# Patient Record
Sex: Female | Born: 1970 | Race: Black or African American | Hispanic: No | Marital: Single | Smoking: Current every day smoker
Health system: Southern US, Community
[De-identification: ages and names within clinical notes are randomized; demographics above are authoritative.]

## PROBLEM LIST (undated history)

## (undated) DIAGNOSIS — I4891 Unspecified atrial fibrillation: Secondary | ICD-10-CM

## (undated) DIAGNOSIS — G459 Transient cerebral ischemic attack, unspecified: Secondary | ICD-10-CM

## (undated) DIAGNOSIS — E119 Type 2 diabetes mellitus without complications: Secondary | ICD-10-CM

---

## 2004-02-05 ENCOUNTER — Other Ambulatory Visit: Admission: RE | Admit: 2004-02-05 | Discharge: 2004-02-05 | Payer: Self-pay | Admitting: Obstetrics and Gynecology

## 2004-04-25 ENCOUNTER — Inpatient Hospital Stay (HOSPITAL_COMMUNITY): Admission: AD | Admit: 2004-04-25 | Discharge: 2004-04-25 | Payer: Self-pay | Admitting: Obstetrics and Gynecology

## 2004-05-10 ENCOUNTER — Inpatient Hospital Stay (HOSPITAL_COMMUNITY): Admission: AD | Admit: 2004-05-10 | Discharge: 2004-05-10 | Payer: Self-pay | Admitting: Obstetrics and Gynecology

## 2004-05-19 ENCOUNTER — Inpatient Hospital Stay (HOSPITAL_COMMUNITY): Admission: AD | Admit: 2004-05-19 | Discharge: 2004-05-19 | Payer: Self-pay | Admitting: Obstetrics and Gynecology

## 2004-05-24 ENCOUNTER — Inpatient Hospital Stay (HOSPITAL_COMMUNITY): Admission: AD | Admit: 2004-05-24 | Discharge: 2004-05-24 | Payer: Self-pay | Admitting: Obstetrics and Gynecology

## 2004-05-28 ENCOUNTER — Inpatient Hospital Stay (HOSPITAL_COMMUNITY): Admission: AD | Admit: 2004-05-28 | Discharge: 2004-05-28 | Payer: Self-pay | Admitting: Obstetrics and Gynecology

## 2004-06-09 ENCOUNTER — Inpatient Hospital Stay (HOSPITAL_COMMUNITY): Admission: AD | Admit: 2004-06-09 | Discharge: 2004-06-12 | Payer: Self-pay | Admitting: Obstetrics and Gynecology

## 2004-07-02 ENCOUNTER — Inpatient Hospital Stay (HOSPITAL_COMMUNITY): Admission: AD | Admit: 2004-07-02 | Discharge: 2004-07-02 | Payer: Self-pay | Admitting: Obstetrics and Gynecology

## 2004-07-20 ENCOUNTER — Inpatient Hospital Stay (HOSPITAL_COMMUNITY): Admission: AD | Admit: 2004-07-20 | Discharge: 2004-07-20 | Payer: Self-pay | Admitting: Obstetrics and Gynecology

## 2004-07-22 ENCOUNTER — Inpatient Hospital Stay (HOSPITAL_COMMUNITY): Admission: AD | Admit: 2004-07-22 | Discharge: 2004-07-24 | Payer: Self-pay | Admitting: Obstetrics and Gynecology

## 2004-10-02 ENCOUNTER — Ambulatory Visit (HOSPITAL_COMMUNITY): Admission: RE | Admit: 2004-10-02 | Discharge: 2004-10-02 | Payer: Self-pay | Admitting: Obstetrics and Gynecology

## 2005-06-23 ENCOUNTER — Emergency Department (HOSPITAL_COMMUNITY): Admission: EM | Admit: 2005-06-23 | Discharge: 2005-06-23 | Payer: Self-pay | Admitting: Emergency Medicine

## 2006-08-16 ENCOUNTER — Emergency Department (HOSPITAL_COMMUNITY): Admission: EM | Admit: 2006-08-16 | Discharge: 2006-08-16 | Payer: Self-pay | Admitting: Emergency Medicine

## 2006-08-18 ENCOUNTER — Emergency Department (HOSPITAL_COMMUNITY): Admission: EM | Admit: 2006-08-18 | Discharge: 2006-08-19 | Payer: Self-pay | Admitting: Emergency Medicine

## 2006-08-30 ENCOUNTER — Emergency Department (HOSPITAL_COMMUNITY): Admission: EM | Admit: 2006-08-30 | Discharge: 2006-08-30 | Payer: Self-pay | Admitting: Emergency Medicine

## 2006-09-01 ENCOUNTER — Ambulatory Visit (HOSPITAL_COMMUNITY): Admission: RE | Admit: 2006-09-01 | Discharge: 2006-09-01 | Payer: Self-pay | Admitting: *Deleted

## 2006-09-20 ENCOUNTER — Emergency Department (HOSPITAL_COMMUNITY): Admission: EM | Admit: 2006-09-20 | Discharge: 2006-09-21 | Payer: Self-pay | Admitting: Emergency Medicine

## 2006-10-05 ENCOUNTER — Emergency Department (HOSPITAL_COMMUNITY): Admission: EM | Admit: 2006-10-05 | Discharge: 2006-10-06 | Payer: Self-pay | Admitting: Emergency Medicine

## 2006-10-09 ENCOUNTER — Emergency Department (HOSPITAL_COMMUNITY): Admission: EM | Admit: 2006-10-09 | Discharge: 2006-10-09 | Payer: Self-pay | Admitting: Emergency Medicine

## 2006-10-14 ENCOUNTER — Emergency Department (HOSPITAL_COMMUNITY): Admission: EM | Admit: 2006-10-14 | Discharge: 2006-10-14 | Payer: Self-pay | Admitting: Emergency Medicine

## 2006-10-28 ENCOUNTER — Emergency Department (HOSPITAL_COMMUNITY): Admission: EM | Admit: 2006-10-28 | Discharge: 2006-10-29 | Payer: Self-pay | Admitting: Emergency Medicine

## 2006-11-09 ENCOUNTER — Emergency Department (HOSPITAL_COMMUNITY): Admission: EM | Admit: 2006-11-09 | Discharge: 2006-11-09 | Payer: Self-pay | Admitting: Emergency Medicine

## 2006-11-24 ENCOUNTER — Emergency Department (HOSPITAL_COMMUNITY): Admission: EM | Admit: 2006-11-24 | Discharge: 2006-11-24 | Payer: Self-pay | Admitting: Emergency Medicine

## 2006-11-29 ENCOUNTER — Emergency Department (HOSPITAL_COMMUNITY): Admission: EM | Admit: 2006-11-29 | Discharge: 2006-11-30 | Payer: Self-pay | Admitting: Emergency Medicine

## 2006-12-11 ENCOUNTER — Emergency Department (HOSPITAL_COMMUNITY): Admission: EM | Admit: 2006-12-11 | Discharge: 2006-12-12 | Payer: Self-pay | Admitting: *Deleted

## 2006-12-14 ENCOUNTER — Emergency Department (HOSPITAL_COMMUNITY): Admission: EM | Admit: 2006-12-14 | Discharge: 2006-12-15 | Payer: Self-pay | Admitting: Emergency Medicine

## 2006-12-17 ENCOUNTER — Emergency Department (HOSPITAL_COMMUNITY): Admission: EM | Admit: 2006-12-17 | Discharge: 2006-12-17 | Payer: Self-pay | Admitting: Emergency Medicine

## 2006-12-23 ENCOUNTER — Emergency Department (HOSPITAL_COMMUNITY): Admission: EM | Admit: 2006-12-23 | Discharge: 2006-12-23 | Payer: Self-pay | Admitting: Emergency Medicine

## 2007-01-14 ENCOUNTER — Emergency Department (HOSPITAL_COMMUNITY): Admission: EM | Admit: 2007-01-14 | Discharge: 2007-01-15 | Payer: Self-pay | Admitting: Emergency Medicine

## 2007-01-17 ENCOUNTER — Emergency Department (HOSPITAL_COMMUNITY): Admission: EM | Admit: 2007-01-17 | Discharge: 2007-01-18 | Payer: Self-pay | Admitting: Emergency Medicine

## 2007-01-18 ENCOUNTER — Emergency Department (HOSPITAL_COMMUNITY): Admission: EM | Admit: 2007-01-18 | Discharge: 2007-01-18 | Payer: Self-pay | Admitting: Emergency Medicine

## 2007-01-18 ENCOUNTER — Emergency Department (HOSPITAL_COMMUNITY): Admission: EM | Admit: 2007-01-18 | Discharge: 2007-01-19 | Payer: Self-pay | Admitting: Emergency Medicine

## 2007-01-20 ENCOUNTER — Ambulatory Visit (HOSPITAL_COMMUNITY): Admission: RE | Admit: 2007-01-20 | Discharge: 2007-01-21 | Payer: Self-pay | Admitting: General Surgery

## 2007-01-20 ENCOUNTER — Encounter (INDEPENDENT_AMBULATORY_CARE_PROVIDER_SITE_OTHER): Payer: Self-pay | Admitting: General Surgery

## 2007-01-28 ENCOUNTER — Ambulatory Visit: Payer: Self-pay | Admitting: Vascular Surgery

## 2007-01-28 ENCOUNTER — Ambulatory Visit (HOSPITAL_COMMUNITY): Admission: RE | Admit: 2007-01-28 | Discharge: 2007-01-28 | Payer: Self-pay | Admitting: General Surgery

## 2007-01-28 ENCOUNTER — Encounter (INDEPENDENT_AMBULATORY_CARE_PROVIDER_SITE_OTHER): Payer: Self-pay | Admitting: General Surgery

## 2007-01-29 ENCOUNTER — Emergency Department (HOSPITAL_COMMUNITY): Admission: EM | Admit: 2007-01-29 | Discharge: 2007-01-29 | Payer: Self-pay | Admitting: Emergency Medicine

## 2007-02-03 ENCOUNTER — Emergency Department (HOSPITAL_COMMUNITY): Admission: EM | Admit: 2007-02-03 | Discharge: 2007-02-03 | Payer: Self-pay | Admitting: Emergency Medicine

## 2007-02-10 ENCOUNTER — Emergency Department (HOSPITAL_COMMUNITY): Admission: EM | Admit: 2007-02-10 | Discharge: 2007-02-11 | Payer: Self-pay | Admitting: Emergency Medicine

## 2007-02-22 ENCOUNTER — Emergency Department (HOSPITAL_COMMUNITY): Admission: EM | Admit: 2007-02-22 | Discharge: 2007-02-22 | Payer: Self-pay | Admitting: Emergency Medicine

## 2007-02-26 ENCOUNTER — Emergency Department (HOSPITAL_COMMUNITY): Admission: EM | Admit: 2007-02-26 | Discharge: 2007-02-26 | Payer: Self-pay | Admitting: Emergency Medicine

## 2007-03-21 ENCOUNTER — Emergency Department (HOSPITAL_COMMUNITY): Admission: EM | Admit: 2007-03-21 | Discharge: 2007-03-21 | Payer: Self-pay | Admitting: Emergency Medicine

## 2007-03-31 ENCOUNTER — Emergency Department (HOSPITAL_COMMUNITY): Admission: EM | Admit: 2007-03-31 | Discharge: 2007-03-31 | Payer: Self-pay | Admitting: Emergency Medicine

## 2007-04-03 ENCOUNTER — Emergency Department (HOSPITAL_COMMUNITY): Admission: EM | Admit: 2007-04-03 | Discharge: 2007-04-03 | Payer: Self-pay | Admitting: Emergency Medicine

## 2007-04-05 ENCOUNTER — Emergency Department (HOSPITAL_COMMUNITY): Admission: EM | Admit: 2007-04-05 | Discharge: 2007-04-06 | Payer: Self-pay | Admitting: Emergency Medicine

## 2007-04-10 ENCOUNTER — Emergency Department (HOSPITAL_COMMUNITY): Admission: EM | Admit: 2007-04-10 | Discharge: 2007-04-11 | Payer: Self-pay | Admitting: Emergency Medicine

## 2007-05-28 ENCOUNTER — Emergency Department (HOSPITAL_COMMUNITY): Admission: EM | Admit: 2007-05-28 | Discharge: 2007-05-28 | Payer: Self-pay | Admitting: Emergency Medicine

## 2007-05-31 ENCOUNTER — Emergency Department (HOSPITAL_COMMUNITY): Admission: EM | Admit: 2007-05-31 | Discharge: 2007-05-31 | Payer: Self-pay | Admitting: Emergency Medicine

## 2007-06-15 ENCOUNTER — Emergency Department (HOSPITAL_COMMUNITY): Admission: EM | Admit: 2007-06-15 | Discharge: 2007-06-15 | Payer: Self-pay | Admitting: Emergency Medicine

## 2007-06-16 ENCOUNTER — Emergency Department (HOSPITAL_COMMUNITY): Admission: EM | Admit: 2007-06-16 | Discharge: 2007-06-16 | Payer: Self-pay | Admitting: Emergency Medicine

## 2007-07-12 ENCOUNTER — Emergency Department (HOSPITAL_COMMUNITY): Admission: EM | Admit: 2007-07-12 | Discharge: 2007-07-12 | Payer: Self-pay | Admitting: Emergency Medicine

## 2007-07-15 ENCOUNTER — Emergency Department (HOSPITAL_COMMUNITY): Admission: EM | Admit: 2007-07-15 | Discharge: 2007-07-16 | Payer: Self-pay | Admitting: Emergency Medicine

## 2007-07-17 ENCOUNTER — Emergency Department (HOSPITAL_COMMUNITY): Admission: EM | Admit: 2007-07-17 | Discharge: 2007-07-18 | Payer: Self-pay | Admitting: Emergency Medicine

## 2007-07-19 ENCOUNTER — Emergency Department (HOSPITAL_COMMUNITY): Admission: EM | Admit: 2007-07-19 | Discharge: 2007-07-19 | Payer: Self-pay | Admitting: Emergency Medicine

## 2007-11-30 ENCOUNTER — Emergency Department (HOSPITAL_COMMUNITY): Admission: EM | Admit: 2007-11-30 | Discharge: 2007-12-01 | Payer: Self-pay | Admitting: Emergency Medicine

## 2008-03-13 IMAGING — CT CT ANGIO CHEST
2 of 4 series · 19 of 36 positions shown · IV contrast (omnipaque)
Comparison: 11/24/06.

CLINICAL DATA: Mid sternal chest pain and nausea.   Dyspnea. Question acute pulmonary embolism. 
CT ANGIOGRAPHY OF CHEST:
TECHNIQUE: Multidetector CT imaging of the chest was performed during bolus injection of intravenous contrast.  Multiplanar CT angiographic image reconstructions were generated to evaluate the vascular anatomy.
Contrast: 80 mL Omnipaque 300.

[Series 7: pe 1.0 b40f thins for pacs · axial · 0.70mm/px · z∈[-250,-17]mm · 16 of 259 slices shown]
[im 13/259  lung]
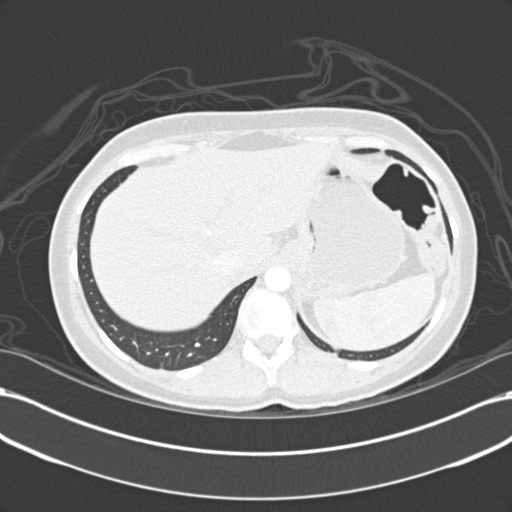
[im 26/259  mediastinal]
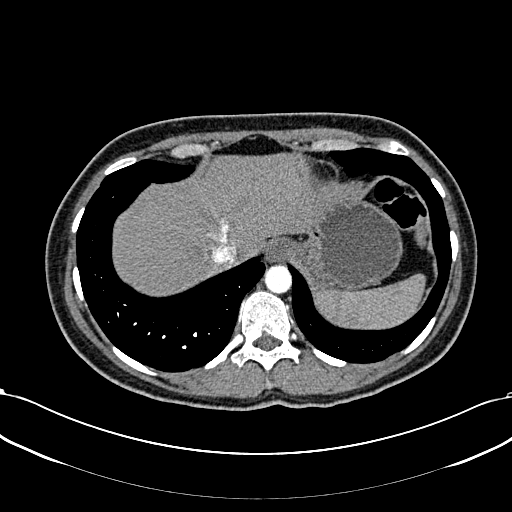
[im 39/259  lung]
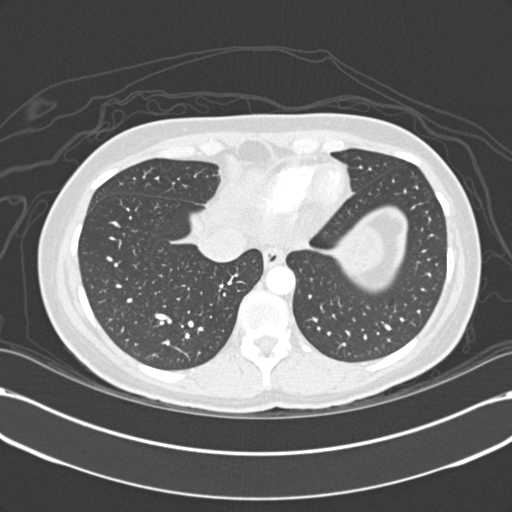
[im 65/259  mediastinal]
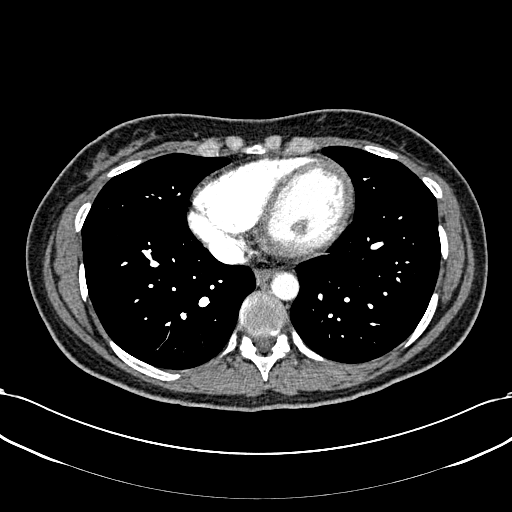
[im 78/259  lung]
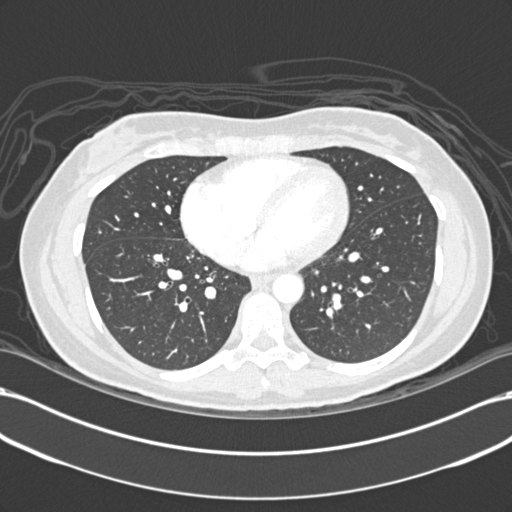
[im 91/259  mediastinal]
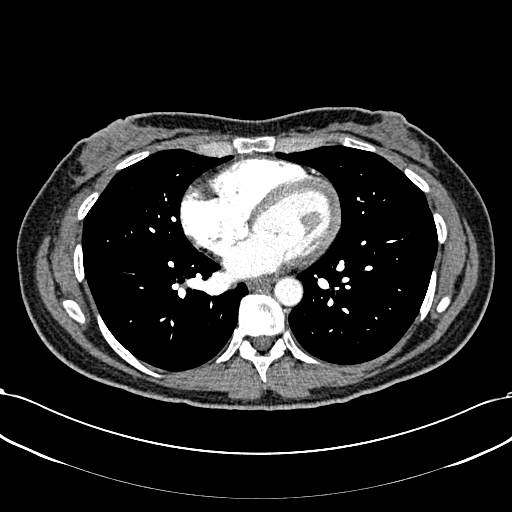
[im 104/259  lung]
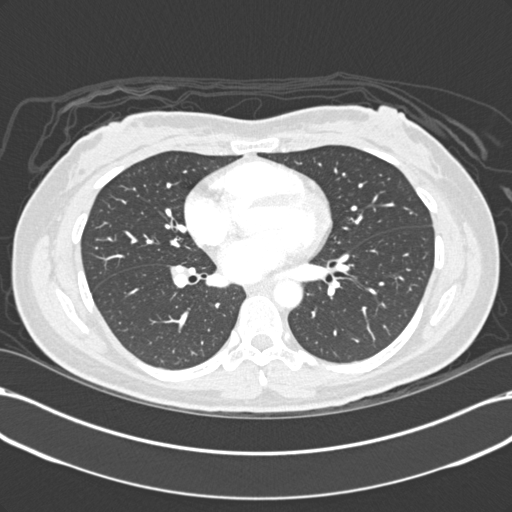
[im 117/259  mediastinal]
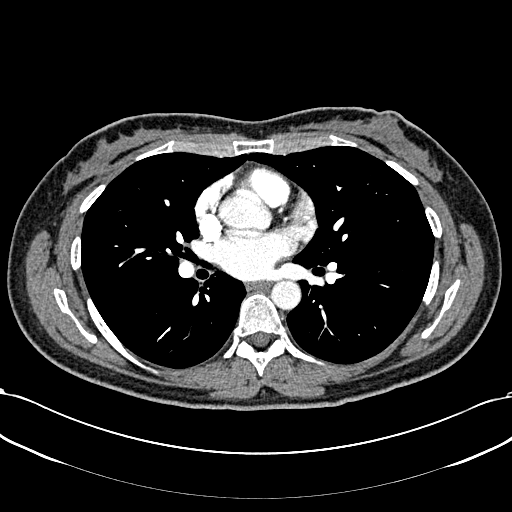
[im 142/259  lung]
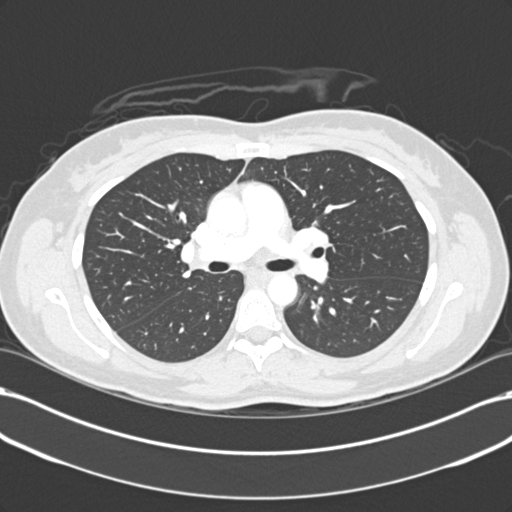
[im 155/259  mediastinal]
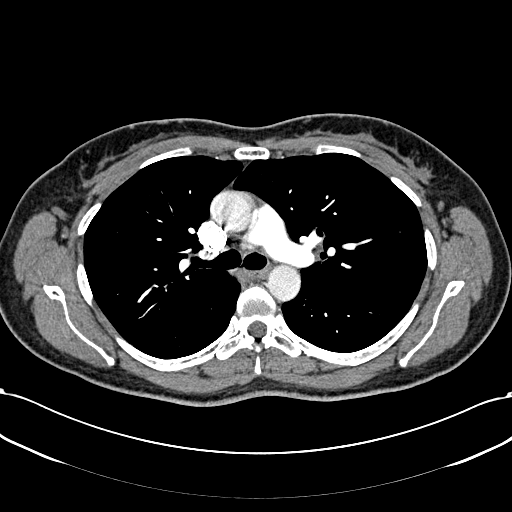
[im 168/259  lung]
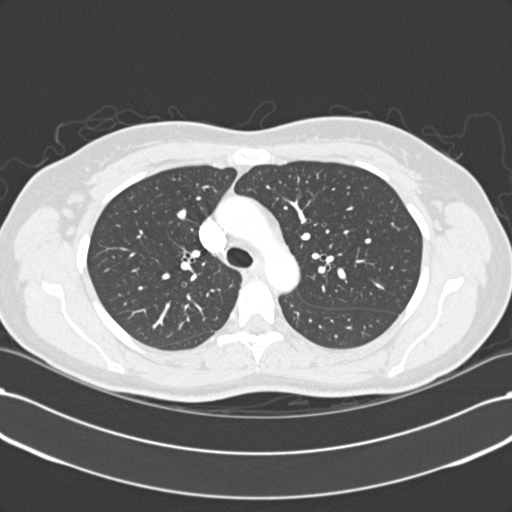
[im 181/259  mediastinal]
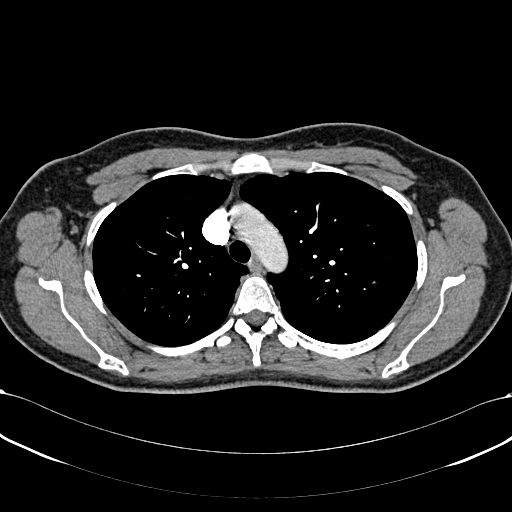
[im 194/259  lung]
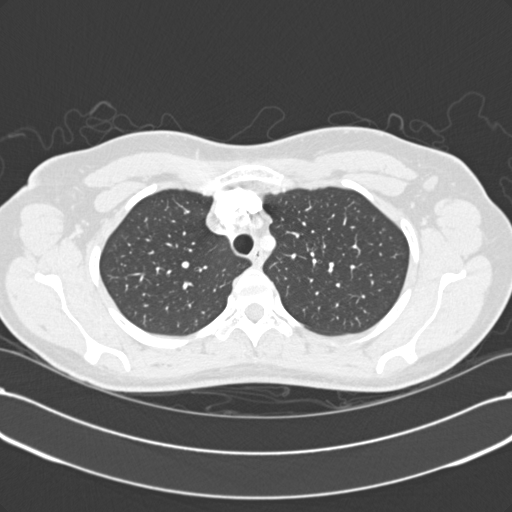
[im 220/259  mediastinal]
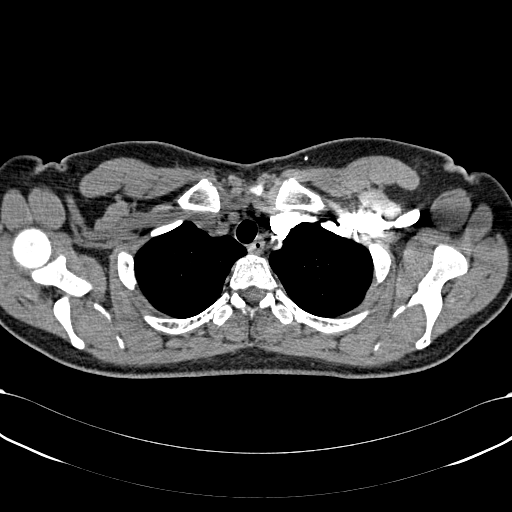
[im 233/259  lung]
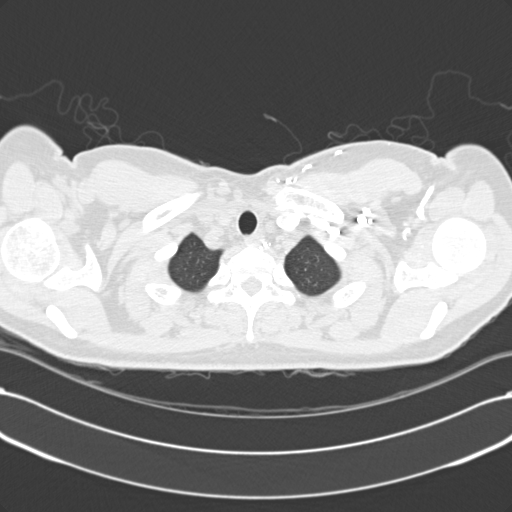
[im 246/259  mediastinal]
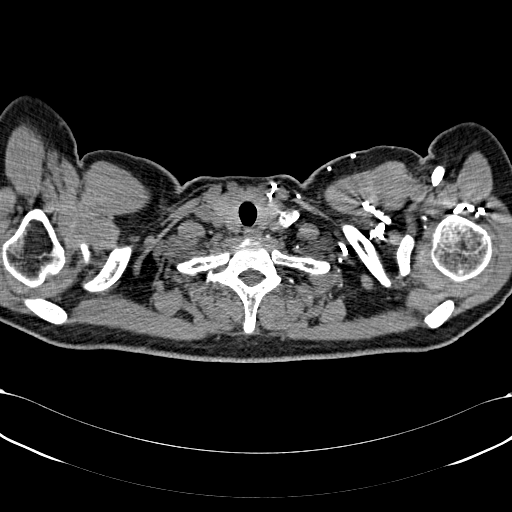

[Series 602: coronal images · coronal · 0.70mm/px · 3 of 67 slices shown]
[im 14/67  mediastinal]
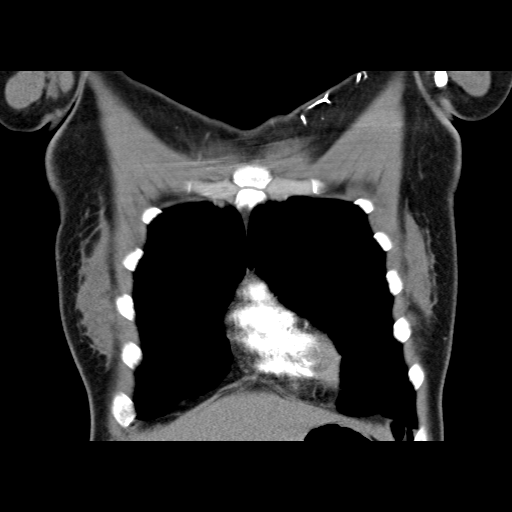
[im 27/67  mediastinal]
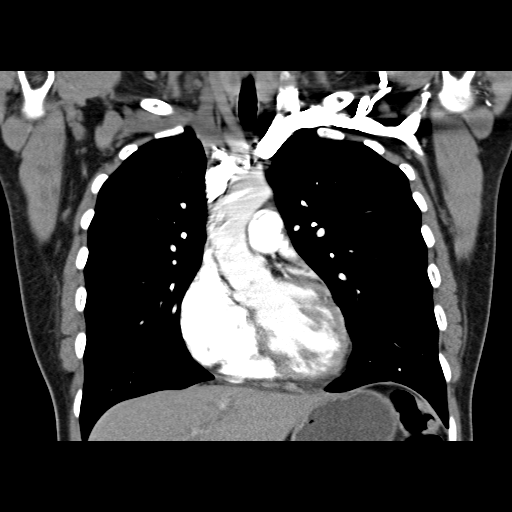
[im 40/67  mediastinal]
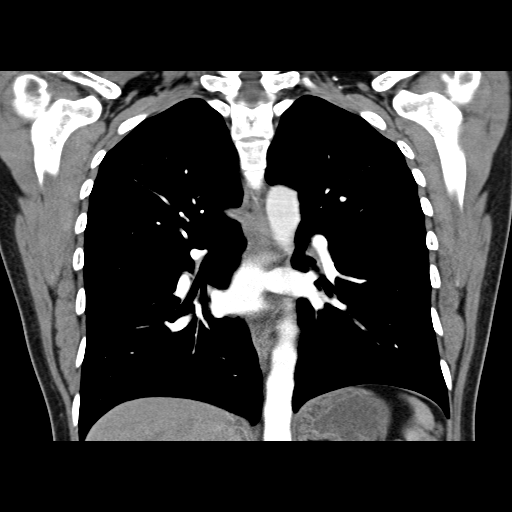

[19 of 36 positions shown; findings below may reference images not displayed]

FINDINGS: There is excellent opacification of the pulmonary arteries. No pulmonary emboli are demonstrated. The mediastinum has a stable appearance without adenopathy or hemorrhage. There is no pleural or pericardial effusion. There is a small lesion posteriorly in the right lobe of the thyroid, measuring 8 mm in diameter. In retrospect, this is unchanged from the prior chest CT of 11/24/06.  I note that the patient had an MRI of the cervical spine on 09/01/06. In reviewing that study, the small thyroid lesion demonstrated high signal on non fat-suppressed T2- and T1-weighted images, and this may reflect a small colloid cyst. The appearance is not entirely specific. The lungs are clear.
IMPRESSION: 1.  No evidence of acute pulmonary embolism or acute chest process.
2.  The small thyroid nodule is unchanged from available priors dating back to 09/01/06. I would suggest ultrasound correlation as this appears to be a single lesion.

## 2008-03-14 ENCOUNTER — Emergency Department (HOSPITAL_COMMUNITY): Admission: EM | Admit: 2008-03-14 | Discharge: 2008-03-14 | Payer: Self-pay | Admitting: Emergency Medicine

## 2008-03-29 ENCOUNTER — Emergency Department (HOSPITAL_COMMUNITY): Admission: EM | Admit: 2008-03-29 | Discharge: 2008-03-29 | Payer: Self-pay | Admitting: Emergency Medicine

## 2008-05-05 ENCOUNTER — Emergency Department (HOSPITAL_COMMUNITY): Admission: EM | Admit: 2008-05-05 | Discharge: 2008-05-05 | Payer: Self-pay | Admitting: Family Medicine

## 2008-06-25 ENCOUNTER — Emergency Department (HOSPITAL_COMMUNITY): Admission: EM | Admit: 2008-06-25 | Discharge: 2008-06-25 | Payer: Self-pay | Admitting: Emergency Medicine

## 2008-07-14 ENCOUNTER — Emergency Department (HOSPITAL_COMMUNITY): Admission: EM | Admit: 2008-07-14 | Discharge: 2008-07-14 | Payer: Self-pay | Admitting: Emergency Medicine

## 2008-07-30 ENCOUNTER — Emergency Department (HOSPITAL_COMMUNITY): Admission: EM | Admit: 2008-07-30 | Discharge: 2008-07-30 | Payer: Self-pay | Admitting: Family Medicine

## 2008-07-31 ENCOUNTER — Emergency Department (HOSPITAL_COMMUNITY): Admission: EM | Admit: 2008-07-31 | Discharge: 2008-07-31 | Payer: Self-pay | Admitting: *Deleted

## 2008-10-23 ENCOUNTER — Observation Stay (HOSPITAL_COMMUNITY): Admission: EM | Admit: 2008-10-23 | Discharge: 2008-10-23 | Payer: Self-pay | Admitting: Emergency Medicine

## 2008-10-31 ENCOUNTER — Emergency Department (HOSPITAL_COMMUNITY): Admission: EM | Admit: 2008-10-31 | Discharge: 2008-10-31 | Payer: Self-pay | Admitting: Emergency Medicine

## 2008-11-22 ENCOUNTER — Emergency Department (HOSPITAL_COMMUNITY): Admission: EM | Admit: 2008-11-22 | Discharge: 2008-11-23 | Payer: Self-pay | Admitting: Emergency Medicine

## 2008-12-05 ENCOUNTER — Emergency Department (HOSPITAL_COMMUNITY): Admission: EM | Admit: 2008-12-05 | Discharge: 2008-12-05 | Payer: Self-pay | Admitting: Family Medicine

## 2008-12-07 ENCOUNTER — Emergency Department (HOSPITAL_COMMUNITY): Admission: EM | Admit: 2008-12-07 | Discharge: 2008-12-07 | Payer: Self-pay | Admitting: Emergency Medicine

## 2009-02-22 ENCOUNTER — Emergency Department (HOSPITAL_COMMUNITY): Admission: EM | Admit: 2009-02-22 | Discharge: 2009-02-22 | Payer: Self-pay | Admitting: Emergency Medicine

## 2009-04-16 ENCOUNTER — Emergency Department (HOSPITAL_COMMUNITY): Admission: EM | Admit: 2009-04-16 | Discharge: 2009-04-16 | Payer: Self-pay | Admitting: Emergency Medicine

## 2009-06-16 ENCOUNTER — Emergency Department (HOSPITAL_COMMUNITY): Admission: EM | Admit: 2009-06-16 | Discharge: 2009-06-16 | Payer: Self-pay | Admitting: Emergency Medicine

## 2009-07-09 ENCOUNTER — Emergency Department (HOSPITAL_COMMUNITY): Admission: EM | Admit: 2009-07-09 | Discharge: 2009-07-09 | Payer: Self-pay | Admitting: Emergency Medicine

## 2009-08-20 ENCOUNTER — Emergency Department (HOSPITAL_COMMUNITY): Admission: EM | Admit: 2009-08-20 | Discharge: 2009-08-20 | Payer: Self-pay | Admitting: Emergency Medicine

## 2009-09-11 ENCOUNTER — Emergency Department (HOSPITAL_COMMUNITY): Admission: EM | Admit: 2009-09-11 | Discharge: 2009-09-12 | Payer: Self-pay | Admitting: Emergency Medicine

## 2009-10-27 ENCOUNTER — Emergency Department (HOSPITAL_COMMUNITY): Admission: EM | Admit: 2009-10-27 | Discharge: 2009-10-28 | Payer: Self-pay | Admitting: Emergency Medicine

## 2010-02-14 ENCOUNTER — Emergency Department (HOSPITAL_COMMUNITY): Admission: EM | Admit: 2010-02-14 | Discharge: 2010-02-15 | Payer: Self-pay | Admitting: Emergency Medicine

## 2010-04-17 ENCOUNTER — Emergency Department (HOSPITAL_COMMUNITY): Admission: EM | Admit: 2010-04-17 | Discharge: 2010-04-17 | Payer: Self-pay | Admitting: Emergency Medicine

## 2010-05-24 ENCOUNTER — Emergency Department (HOSPITAL_COMMUNITY): Admission: EM | Admit: 2010-05-24 | Discharge: 2010-05-24 | Payer: Self-pay | Admitting: Emergency Medicine

## 2010-06-19 ENCOUNTER — Emergency Department (HOSPITAL_COMMUNITY): Admission: EM | Admit: 2010-06-19 | Discharge: 2009-10-28 | Payer: Self-pay | Admitting: Emergency Medicine

## 2010-07-07 ENCOUNTER — Emergency Department (HOSPITAL_COMMUNITY)
Admission: EM | Admit: 2010-07-07 | Discharge: 2010-07-07 | Payer: Self-pay | Source: Home / Self Care | Admitting: Emergency Medicine

## 2010-08-03 ENCOUNTER — Encounter: Payer: Self-pay | Admitting: Emergency Medicine

## 2010-09-23 LAB — URINALYSIS, ROUTINE W REFLEX MICROSCOPIC
Bilirubin Urine: NEGATIVE
Hgb urine dipstick: NEGATIVE
Ketones, ur: NEGATIVE mg/dL
Nitrite: NEGATIVE
Protein, ur: NEGATIVE mg/dL
Specific Gravity, Urine: 1.027 (ref 1.005–1.030)
Urobilinogen, UA: 1 mg/dL (ref 0.0–1.0)

## 2010-09-23 LAB — DIFFERENTIAL
Basophils Absolute: 0 10*3/uL (ref 0.0–0.1)
Eosinophils Absolute: 0.4 10*3/uL (ref 0.0–0.7)
Eosinophils Relative: 5 % (ref 0–5)
Monocytes Absolute: 0.4 10*3/uL (ref 0.1–1.0)

## 2010-09-23 LAB — CBC
HCT: 35.8 % — ABNORMAL LOW (ref 36.0–46.0)
MCH: 30.1 pg (ref 26.0–34.0)
MCHC: 33.8 g/dL (ref 30.0–36.0)
MCV: 88.9 fL (ref 78.0–100.0)
Platelets: 225 10*3/uL (ref 150–400)
RDW: 14.6 % (ref 11.5–15.5)

## 2010-09-23 LAB — GC/CHLAMYDIA PROBE AMP, GENITAL: GC Probe Amp, Genital: NEGATIVE

## 2010-09-27 ENCOUNTER — Emergency Department (HOSPITAL_COMMUNITY)
Admission: EM | Admit: 2010-09-27 | Discharge: 2010-09-27 | Disposition: A | Discharge status: Home / Self Care | Payer: Self-pay | Attending: Emergency Medicine | Admitting: Emergency Medicine

## 2010-09-27 ENCOUNTER — Emergency Department (HOSPITAL_COMMUNITY): Payer: Self-pay

## 2010-09-27 DIAGNOSIS — F17200 Nicotine dependence, unspecified, uncomplicated: Secondary | ICD-10-CM | POA: Insufficient documentation

## 2010-09-27 DIAGNOSIS — S20229A Contusion of unspecified back wall of thorax, initial encounter: Secondary | ICD-10-CM | POA: Insufficient documentation

## 2010-09-27 DIAGNOSIS — W108XXA Fall (on) (from) other stairs and steps, initial encounter: Secondary | ICD-10-CM | POA: Insufficient documentation

## 2010-09-27 DIAGNOSIS — Y92009 Unspecified place in unspecified non-institutional (private) residence as the place of occurrence of the external cause: Secondary | ICD-10-CM | POA: Insufficient documentation

## 2010-10-01 LAB — RAPID STREP SCREEN (MED CTR MEBANE ONLY): Streptococcus, Group A Screen (Direct): NEGATIVE

## 2010-10-05 LAB — POCT I-STAT, CHEM 8
BUN: 6 mg/dL (ref 6–23)
Chloride: 103 mEq/L (ref 96–112)
Creatinine, Ser: 0.4 mg/dL (ref 0.4–1.2)
Sodium: 139 mEq/L (ref 135–145)
TCO2: 27 mmol/L (ref 0–100)

## 2010-10-05 LAB — URINE MICROSCOPIC-ADD ON

## 2010-10-05 LAB — URINALYSIS, ROUTINE W REFLEX MICROSCOPIC
Glucose, UA: NEGATIVE mg/dL
Ketones, ur: NEGATIVE mg/dL
Leukocytes, UA: NEGATIVE
Nitrite: NEGATIVE
Protein, ur: NEGATIVE mg/dL
pH: 6 (ref 5.0–8.0)

## 2010-10-05 LAB — HCG, QUANTITATIVE, PREGNANCY: hCG, Beta Chain, Quant, S: <2 m[IU]/mL (ref <5)

## 2010-10-14 LAB — CBC
Hemoglobin: 15.4 g/dL — ABNORMAL HIGH (ref 12.0–15.0)
MCHC: 32.9 g/dL (ref 30.0–36.0)
Platelets: 237 10*3/uL (ref 150–400)
RDW: 15.4 % (ref 11.5–15.5)

## 2010-10-14 LAB — URINE MICROSCOPIC-ADD ON

## 2010-10-14 LAB — COMPREHENSIVE METABOLIC PANEL
ALT: 34 U/L (ref 0–35)
Albumin: 4.5 g/dL (ref 3.5–5.2)
Calcium: 9.4 mg/dL (ref 8.4–10.5)
GFR calc Af Amer: >60 mL/min (ref >60)
Glucose, Bld: 124 mg/dL — ABNORMAL HIGH (ref 70–99)
Potassium: 4.5 mEq/L (ref 3.5–5.1)
Sodium: 134 mEq/L — ABNORMAL LOW (ref 135–145)
Total Protein: 8.7 g/dL — ABNORMAL HIGH (ref 6.0–8.3)

## 2010-10-14 LAB — DIFFERENTIAL
Basophils Relative: 0 % (ref 0–1)
Eosinophils Relative: 0 % (ref 0–5)
Lymphs Abs: 0.4 10*3/uL — ABNORMAL LOW (ref 0.7–4.0)
Monocytes Relative: 3 % (ref 3–12)

## 2010-10-14 LAB — PREGNANCY, URINE: Preg Test, Ur: NEGATIVE

## 2010-10-14 LAB — URINALYSIS, ROUTINE W REFLEX MICROSCOPIC
Glucose, UA: NEGATIVE mg/dL
Nitrite: NEGATIVE
Specific Gravity, Urine: 1.027 (ref 1.005–1.030)
pH: 6 (ref 5.0–8.0)

## 2010-10-20 ENCOUNTER — Emergency Department (HOSPITAL_COMMUNITY)
Admission: EM | Admit: 2010-10-20 | Discharge: 2010-10-21 | Disposition: A | Discharge status: Home / Self Care | Payer: Self-pay | Attending: Emergency Medicine | Admitting: Emergency Medicine

## 2010-10-20 DIAGNOSIS — K029 Dental caries, unspecified: Secondary | ICD-10-CM | POA: Insufficient documentation

## 2010-10-20 DIAGNOSIS — K089 Disorder of teeth and supporting structures, unspecified: Secondary | ICD-10-CM | POA: Insufficient documentation

## 2010-10-21 LAB — WET PREP, GENITAL: Trich, Wet Prep: NONE SEEN

## 2010-10-21 LAB — DIFFERENTIAL
Basophils Absolute: 0.1 10*3/uL (ref 0.0–0.1)
Basophils Relative: 1 % (ref 0–1)
Eosinophils Absolute: 0.9 10*3/uL — ABNORMAL HIGH (ref 0.0–0.7)
Monocytes Relative: 8 % (ref 3–12)
Neutrophils Relative %: 61 % (ref 43–77)

## 2010-10-21 LAB — URINALYSIS, ROUTINE W REFLEX MICROSCOPIC
Bilirubin Urine: NEGATIVE
Glucose, UA: NEGATIVE mg/dL
Hgb urine dipstick: NEGATIVE
Hgb urine dipstick: NEGATIVE
Ketones, ur: NEGATIVE mg/dL
Specific Gravity, Urine: 1.017 (ref 1.005–1.030)
Urobilinogen, UA: 1 mg/dL (ref 0.0–1.0)
pH: 7.5 (ref 5.0–8.0)

## 2010-10-21 LAB — POCT I-STAT, CHEM 8
Chloride: 102 mEq/L (ref 96–112)
HCT: 40 % (ref 36.0–46.0)
Potassium: 4 mEq/L (ref 3.5–5.1)

## 2010-10-21 LAB — CBC
MCHC: 32.7 g/dL (ref 30.0–36.0)
MCV: 85.7 fL (ref 78.0–100.0)
Platelets: 243 10*3/uL (ref 150–400)
RBC: 3.98 MIL/uL (ref 3.87–5.11)
RDW: 15.4 % (ref 11.5–15.5)
WBC: 8.7 10*3/uL (ref 4.0–10.5)

## 2010-10-21 LAB — GC/CHLAMYDIA PROBE AMP, GENITAL
Chlamydia, DNA Probe: NEGATIVE
GC Probe Amp, Genital: NEGATIVE

## 2010-10-21 LAB — PREGNANCY, URINE: Preg Test, Ur: NEGATIVE

## 2010-10-21 LAB — POCT PREGNANCY, URINE: Preg Test, Ur: NEGATIVE

## 2010-10-22 LAB — POCT I-STAT, CHEM 8
BUN: 6 mg/dL (ref 6–23)
Creatinine, Ser: 0.7 mg/dL (ref 0.4–1.2)
Potassium: 3.3 mEq/L — ABNORMAL LOW (ref 3.5–5.1)
Sodium: 139 mEq/L (ref 135–145)

## 2010-10-22 LAB — DIFFERENTIAL
Lymphocytes Relative: 32 % (ref 12–46)
Lymphs Abs: 2.8 10*3/uL (ref 0.7–4.0)
Neutrophils Relative %: 55 % (ref 43–77)

## 2010-10-22 LAB — CARDIAC PANEL(CRET KIN+CKTOT+MB+TROPI)
CK, MB: 0.7 ng/mL (ref 0.3–4.0)
Relative Index: 0.6 (ref 0.0–2.5)
Total CK: 108 U/L (ref 7–177)

## 2010-10-22 LAB — POCT CARDIAC MARKERS
Myoglobin, poc: 31.8 ng/mL (ref 12–200)
Myoglobin, poc: 34.1 ng/mL (ref 12–200)
Troponin i, poc: <0.05 ng/mL (ref 0.00–0.09)
Troponin i, poc: <0.05 ng/mL (ref 0.00–0.09)

## 2010-10-22 LAB — CBC
HCT: 33 % — ABNORMAL LOW (ref 36.0–46.0)
Platelets: 195 10*3/uL (ref 150–400)
WBC: 8.6 10*3/uL (ref 4.0–10.5)

## 2010-10-22 LAB — LIPID PANEL
LDL Cholesterol: 67 mg/dL (ref 0–99)
Total CHOL/HDL Ratio: 5.7 RATIO
Triglycerides: 223 mg/dL — ABNORMAL HIGH (ref <150)
VLDL: 45 mg/dL — ABNORMAL HIGH (ref 0–40)

## 2010-10-22 LAB — RAPID URINE DRUG SCREEN, HOSP PERFORMED: Benzodiazepines: NOT DETECTED

## 2010-10-22 LAB — CK TOTAL AND CKMB (NOT AT ARMC): Total CK: 153 U/L (ref 7–177)

## 2010-10-26 ENCOUNTER — Emergency Department (HOSPITAL_COMMUNITY)
Admission: EM | Admit: 2010-10-26 | Discharge: 2010-10-26 | Discharge status: Against Medical Advice | Payer: Self-pay | Attending: Emergency Medicine | Admitting: Emergency Medicine

## 2010-10-26 DIAGNOSIS — K089 Disorder of teeth and supporting structures, unspecified: Secondary | ICD-10-CM | POA: Insufficient documentation

## 2010-10-27 ENCOUNTER — Emergency Department (HOSPITAL_COMMUNITY)
Admission: EM | Admit: 2010-10-27 | Discharge: 2010-10-27 | Disposition: A | Discharge status: Home / Self Care | Payer: Self-pay | Attending: Emergency Medicine | Admitting: Emergency Medicine

## 2010-10-27 DIAGNOSIS — H02849 Edema of unspecified eye, unspecified eyelid: Secondary | ICD-10-CM | POA: Insufficient documentation

## 2010-10-27 DIAGNOSIS — R221 Localized swelling, mass and lump, neck: Secondary | ICD-10-CM | POA: Insufficient documentation

## 2010-10-27 DIAGNOSIS — K047 Periapical abscess without sinus: Secondary | ICD-10-CM | POA: Insufficient documentation

## 2010-10-27 DIAGNOSIS — R220 Localized swelling, mass and lump, head: Secondary | ICD-10-CM | POA: Insufficient documentation

## 2010-10-27 LAB — POCT URINALYSIS DIP (DEVICE)
Glucose, UA: NEGATIVE mg/dL
Nitrite: NEGATIVE
Urobilinogen, UA: 1 mg/dL (ref 0.0–1.0)
pH: 8.5 — ABNORMAL HIGH (ref 5.0–8.0)

## 2010-10-27 LAB — GC/CHLAMYDIA PROBE AMP, GENITAL: GC Probe Amp, Genital: NEGATIVE

## 2010-11-25 NOTE — Op Note (Signed)
[name], [name]             ACCOUNT NO.:  [id number]   MEDICAL RECORD NO.:  [id number]          PATIENT TYPE:  OIB   LOCATION:  1310                         FACILITY:  [facility]   PHYSICIAN:  Sharlet Salina T. Hoxworth, M.D.DATE OF BIRTH:  1971-01-03   DATE OF PROCEDURE:  01/20/2007  DATE OF DISCHARGE:                               OPERATIVE REPORT   POSTOPERATIVE DIAGNOSIS:  Cholelithiasis, cholecystitis.   SURGICAL PROCEDURES:  Laparoscopic cholecystectomy with intraoperative  cholangiogram.   SURGEON:  Sharlet Salina T. Hoxworth, M.D.   ANESTHESIA:  General.   BRIEF HISTORY:  Tina [name] is a 40 year old female who about 1 week ago  developed acute epigastric abdominal pain, nausea, vomiting.  She  presented to the emergency room and subsequent ultrasound has revealed  cholelithiasis.  She had presented emergency room two further occasions  during the past week with recurrent pain and has persistent nausea and  discomfort.  Examination in office yesterday revealed some moderate  epigastric and right upper quadrant tenderness.  She is felt to have  ongoing symptomatic gallbladder disease and laparoscopic  cholecystectomy, cholangiogram has been recommended and accepted.  The  nature of the procedure, the indications, risks of bleeding, infection,  bile leak, bile duct injury were discussed understood.  Now brought to  the operating room for this procedure.   DESCRIPTION OF OPERATION:  The patient brought to operating room and  placed in supine position on the operating table and general  endotracheal anesthesia was induced.  She received preoperative  antibiotics.  PAS were placed.  The abdomen was widely sterilely prepped  and draped.  Correct patient and procedure were verified.  Local  anesthesia was used to infiltrate the trocar sites.  A 1 cm incision was  made at the umbilicus.  Dissection carried down to midline fascia which  was sharply incised for 1 cm.  The perineum was  entered under direct  vision.  Through a mattress sutures of 0-0 Vicryl.  Trocars placed and  pneumoperitoneum established.  Under direct vision a 10 mm trocar was  placed in subxiphoid area and two 5 mm trocars on the right subcostal  margin.  The gallbladder was visualized and had some omental adhesions  but was not acutely inflamed.  The fundus was grasped, elevated up over  the liver.  Omental adhesions were carefully taken down with sharp and  cautery dissection.  The infundibulum was exposed and retracted  inferolaterally.  Peritoneum anterior and posterior to Calot's triangle  was incised and fibrofatty tissue was stripped off the gallbladder  toward the porta hepatis.  The distal gallbladder and Calot's triangle  were thoroughly dissected.  The cystic duct was identified, dissected  free for about a centimeter and the cystic gallbladder junction  dissected 360 degrees.  The anatomy was clear that the cystic duct was  clipped at the gallbladder junction and operative cholangiogram obtained  through the cystic duct.  This showed normal intrahepatic and common  bile ducts with free flow into the duodenum and no filling defects.  Following this, cholangiocath was removed and the cystic duct was triple  clipped proximally and  divided.  The cystic artery clearly seen coursing  up on the gallbladder wall was divided between clips.  The gallbladder  was dissected free from its bed using hook cautery and removed through  the umbilicus.  The operative site was inspected for hemostasis and  appeared complete.  There is no evidence of trocar injury.  All CO2  evacuated.  Trocars removed.  The mattress  suture was secured at the umbilicus.  Skin incisions were closed with  interrupted subcuticular 4-0 Monocryl and Steri-Strips.  Sponge, needle  and instrument counts correct.  Dry sterile dressing were applied.  The  patient taken recovery room in good condition.      [name],  M.D.  Electronically Signed     BTH/MEDQ  D:  01/20/2007  T:  01/21/2007  Job:  [id number]

## 2010-11-25 NOTE — Discharge Summary (Signed)
NAME:  Tina [name], Tina [name] NO.:  [id number]   MEDICAL RECORD NO.:  [id number]          PATIENT TYPE:  OBV   LOCATION:  5504                         FACILITY:  [facility]   PHYSICIAN:  Altha Harm, MDDATE OF BIRTH:  October 20, 1970   DATE OF ADMISSION:  10/22/2008  DATE OF DISCHARGE:  10/23/2008                               DISCHARGE SUMMARY   DISCHARGE DISPOSITION:  Home.   FINAL DISCHARGE DIAGNOSES:  1. Chest pain, likely noncardiac, pain resolved with ibuprofen.  2. Hyperlipidemia.  3. Mild hypokalemia.   DISCHARGE MEDICATIONS:  Pravastatin 20 mg p.o. q.h.s.   CONSULTANTS:  None.   PROCEDURES:  None.   DIAGNOSTIC STUDIES:  Portable chest x-ray done on admission which shows  normal size heart.  Lungs clear with normal vascularity.  Unremarkable  bones.   ALLERGIES:  No known drug allergies.   PRIMARY CARE PHYSICIAN:  Dr. Theone Stanley.   CHIEF COMPLAINT:  Chest pain.   HISTORY OF PRESENT ILLNESS:  Please see the H and P dictated by Dr.  Oralia Rud for details of the HPI.   HOSPITAL COURSE:  The patient was admitted and serial enzymes performed.  The patient had 3 sets of enzymes that were all normal with troponins  all less than 0.01.  The patient is moderate-risk owing to her tobacco  use and hyperlipidemia.  However, the patient refused to remain in the  hospital for further risk stratification and stated that she needed to  go home.  The patient had an echocardiogram scheduled to be done which  was not done prior to her discharge.  I spoke with [facility],  and they have agreed that they will call the patient tomorrow to set up  further testing for risk stratification.  I have discussed with the  patient the risks of the chest pain being cardiac.  She understands  fully and states that she will not stay in the hospital for any further  testing at this time and wanted to go home.  The patient is being  discharged with followup with [facility]  for further risk  stratification.   Pertinent laboratory studies include total cholesterol 136,  triglycerides 223, HDL 24, LDL 67, VLDL 45.  The patient was orally  repeated with potassium.   TOTAL TIME FOR DISCHARGE:  32 minutes.      Altha Harm, MD  Electronically Signed     MAM/MEDQ  D:  10/23/2008  T:  10/23/2008  Job:  [id number]

## 2010-11-25 NOTE — H&P (Signed)
NAME:  Tina [name], Tina [name] NO.:  [id number]   MEDICAL RECORD NO.:  [id number]          PATIENT TYPE:  EMS   LOCATION:  MAJO                         FACILITY:  [facility]   PHYSICIAN:  Darryl D. Prime, MD    DATE OF BIRTH:  [birth year]   DATE OF ADMISSION:  10/22/2008  DATE OF DISCHARGE:                              HISTORY & PHYSICAL   CHIEF COMPLAINT:  Chest pain.   HISTORY OF PRESENT ILLNESS:  Tina [name] is a 40 year old female with no  significant past medical history.  She does have a history of tobacco  abuse.  She presents with chest pain.  The patient notes she was in her  usual state of health when today while seated at work has gradually  increasing substernal chest pain, pressure sensation, around 4 p.m.  There was no radiation.  There was associated shortness of breath but no  associated wheezing, cough, fever, nausea, or sweats.  She notes  symptoms subsided somewhat but at home they became more severe, and she  called 911.  EMS gave her aspirin 324 mg and sublingual nitroglycerin,  which did not help the pain.  She was seen in the ER here and was given  an additional aspirin and sublingual nitroglycerin, which also did not  help the pain.  The patient was chest painfree at the time of the  interview.   PAST MEDICAL/SURGICAL HISTORY:  She is status post cholecystectomy.  Status post appendectomy.  She denies any chronic medical problems.   ALLERGIES:  No known drug allergies.   MEDICATIONS:  None.   SOCIAL HISTORY:  History of tobacco abuse since the age of 47.  A  quarter pack a day.  No alcohol.  She denies any illicit drug use.  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   FAMILY HISTORY:  Negative for premature coronary artery disease.   REVIEW OF SYSTEMS:  A 14-point review of systems is negative.   PHYSICAL EXAMINATION:  VITAL SIGNS:  Temperature 98.3, blood pressure  110/71, pulse 54, respiratory rate 16, sats 98% on room air.  GENERAL:  This is a female who looks her stated age in no acute  distress.  HEENT:  Normocephalic and atraumatic.  Pupils are equal, round and  reactive to light.  Extraocular movements are intact.  Oropharynx shows  dental caries.  NECK:  Supple with no lymphadenopathy or thyromegaly.  No carotid  bruits.  LUNGS:  Clear to auscultation bilaterally.  CARDIOVASCULAR:  Regular rate and rhythm with no murmurs.  ABDOMEN:  Soft, nontender, nondistended with no hepatosplenomegaly.  EXTREMITIES:  No clubbing, cyanosis or edema.  NEUROLOGIC:  She is alert and oriented x4.  Cranial nerves II-XII are  grossly intact.  Strength and sensation are grossly intact.  SKIN:  No rashes.   LABORATORY DATA:  White count 8.6, hemoglobin 11.1, hematocrit 32,  platelets 195, segs %.  Sodium 137 with a potassium of 3.3.  Chloride  104, bicarb 23, BUN 6, creatinine 0.7, glucose 107.  Urine pregnancy  test was negative.  Point-of-care cardiac markers were unremarkable.  EKG showed sinus rhythm at a vent  rate of 63 beats per minute that was  normal with no abnormal ST segments.   She had a prior EKG performed in January, 2009 that was also  unremarkable.   ASSESSMENT/PLAN:  This is a patient with a history of one risk factor,  tobacco abuse, who presents with atypical chest pain.  At this time, she  will be admitted for rule-out myocardial infarction with cardiac  markers.  She will be on telemetry to rule out her arrhythmia as well.  We will check urine drug screen, TSH, and counsel concerning tobacco  discontinuance.  We will check a lipid panel.  We will continue aspirin  and will replete her potassium.  Will also check a D-dimer to rule out  deep venous thrombosis/pulmonary embolus syndrome.      Darryl D. Prime, MD  Electronically Signed     DDP/MEDQ  D:  10/23/2008  T:  10/23/2008  Job:  [id number]

## 2010-11-28 NOTE — Op Note (Signed)
NAME:  Tina [name], Tina [name]             ACCOUNT NO.:  [id number]   MEDICAL RECORD NO.:  [id number]          PATIENT TYPE:  AMB   LOCATION:  SDC                           FACILITY:  [facility]   PHYSICIAN:  Charles A. Delcambre, MDDATE OF BIRTH:  [birth year]   DATE OF PROCEDURE:  10/02/2004  DATE OF DISCHARGE:                                 OPERATIVE REPORT   PREOPERATIVE DIAGNOSIS:  Undesired fertility.   POSTOPERATIVE DIAGNOSIS:  Undesired fertility.   PROCEDURE:  Laparoscopic bilateral tubal ligation with Falope rings.   SURGEON:  Charles A. Sydnee Cabal, M.D.   ASSISTANT:  None.   COMPLICATIONS:  None.   ESTIMATED BLOOD LOSS:  Less than 5 mL and this mainly through what was seen  through the Cohen cannula with blood flow from menstrual cycle.  See  dictation below.   FINDINGS:  Normal pelvis, normal tubes and ovaries, normal abdominal  findings, normal uterus, normal posterior cul-de-sac.   ANESTHESIA:  General by the endotracheal route.   DESCRIPTION OF PROCEDURE:  The patient was taken to the operating room and  placed in the supine position.  General anesthesia was induced without  difficulty.  She was then placed in dorsal lithotomy position in Universal  stirrups and sterile prep and drape was undertaken.  Single tooth tenaculum  was placed on the anterior lip of the cervix and Cohen cannula was placed  for uterine manipulation during the case.  Umbilical incision was made  approximately 1 cm after 0.25% Marcaine plain was injected.  0.5 cm incision  was made 2 fingerbreadths above the symphysis pubis in the midline with like  injection approximately 1 mL of 0.25% plain Marcaine.  Veress needle was  placed with anterior traction on the abdominal wall at the umbilical port  site.  Aspiration, injection, reaspiration, hanging drop test, and  insuflation pressures less than 10 mmHg at 1.5 liters CO2 per minute, all  indicated intraperitoneal location.  Adequate pneumoperitoneum was  noted at  2 liters insufflation.  The Veress needle was removed.  10 mm trocar was  then placed with anterior traction on the abdominal wall.  There was no  injury to the bowel or bladder or vascular structures with immediate  verification noted by placement of the scope.  The pelvis as well as intra-  abdominal findings were noted above.  Under direct visualization, trocar for  Falope ring applicator was placed through a separate stab incision at the  suprapubic area.  There was no damage to surrounding structures.  Falope  rings were applied approximately 2 cm from the uterine cornual regions on  adequate side.  Adequate knuckle of tube pulled up through the rings and  blanched.  There was no damage to the surrounding structures.  Deflation was  allowed to occur.  Peritoneal edges were visualized under low pressure.  There was no evidence of bleeding at low pressure.  Instruments were  removed.  Peritoneal edges were visualized.  Subcutaneous cautery was used  at the suprapubic site for good hemostasis.  Hemostasis was excellent at  both incision sites.  0 Vicryl was used to plicate  the subcutaneous fascia  at the umbilical port.  Subcutaneous running suture of 3-0 Monocryl was used  to close the skin at the umbilical port.  Hemostasis was excellent.  Dermabond was then placed at the suprapubic site for closure.  Vaginal  instruments were removed.  Electrocautery was used at the tenaculum site on  the patient's left.  Hemostasis was excellent.  Menstrual flow was noted  through the tenaculum.  This was moderate consistent with the patient's  presentation upon arrival.  Very minimal bleeding had been noted from the  patient's incision sites and tenaculum site.  Needle, sponge, and instrument  counts correct.  The patient was taken to the recovery room with physician  in attendance having tolerated the procedure well.      CAD/MEDQ  D:  10/02/2004  T:  10/02/2004  Job:  [id number]

## 2010-11-28 NOTE — H&P (Signed)
NAME:  Tina [name], Tina [name]             ACCOUNT NO.:  [id number]   MEDICAL RECORD NO.:  [id number]          PATIENT TYPE:  AMB   LOCATION:  SDC                           [facility]ACILITY[facility]  [facility]   PHYSICIAN:  Charles A. Delcambre, MDDATE OF BIRTH:  1970-08-04   DATE OF ADMISSION:  10/02/2004  DATE OF DISCHARGE:                                HISTORY & PHYSICAL   This patient will be admitted on October 02, 2004 to undergo a laparoscopic  tubal ligation as she requests permanent sterilization. She is a 40-year-  old, para 1-0-0-1, LMP August 30, 2004, postpartum now after delivery  June 09, 2004. Alternative forms of contraception have been discussed,  she gives informed consent. Accepts risks of infection, bleeding, bowel,  bladder damage, ureteral damage, blood product risks including hepatitis and  HIV exposure. She understands permanent irreversibility by design of this  procedure, failure risk approximately 1/400, increased risk of ectopic  pregnancy, 50% risk of intrauterine pregnancy versus ectopic pregnancy  should failure occur. She gives informed consent, accepts these risks and  wishes to proceed.   PAST MEDICAL HISTORY:  Recent pregnancy complicated by Trichomonas,  chlamydia and syphilis all treated.   PAST SURGICAL HISTORY:  Appendectomy.   MEDICATIONS:  Currently none and she is abstinent.   ALLERGIES:  No known drug allergies.   SOCIAL HISTORY:  She smokes less than a 1/4 pack of cigarettes per day, no  alcohol or drug use in the past or currently as stated by patient and she is  abstinent at this time.   FAMILY HISTORY:  No major illnesses.   REVIEW OF SYMPTOMS:  No fever, chills, nausea, vomiting, diarrhea,  constipation, chest pain, shortness of breath or wheezing, frequency,  urgency or dysuria.   PHYSICAL EXAMINATION:  GENERAL:  Alert and oriented x3, no distress.  VITAL SIGNS:  Blood pressure 130/80, respirations 18, pulse 80, weight 134  pounds.  HEENT:  Grossly  within normal limits.  NECK:  Supple without thyromegaly.  LUNGS:  Clear bilaterally.  HEART:  Regular rate and rhythm.  BREASTS:  Symmetrical not further examined.  ABDOMEN:  Soft, flat and nontender. A subumbilical scar was noted umbilicus  and right lower quadrant from an appendectomy.  PELVIC:  Normal external female genitalia, Bartholin, urethra and Skene's  normal. Uterus not enlarged, mid plane.  EXTREMITIES:  Nontender.   ASSESSMENT:  Undesired fertility.   PLAN:  Laparoscopic tubal ligation. Counseling as noted above.  Informed  consent is given.  Preoperative CBC, serum HCG.  All questions are answered,  she gives informed consent and will proceed as outlined. A state  sterilization consent form has been signed on May 12, 2004 for  sterilization.      CAD/MEDQ  D:  09/24/2004  T:  09/24/2004  Job:  [id number]

## 2010-11-28 NOTE — H&P (Signed)
NAMECATHA, ONTKO             ACCOUNT NO.:  [id number]   MEDICAL RECORD NO.:  [id number]          PATIENT TYPE:  INP   LOCATION:  9151                          [facility]  [facility]   PHYSICIAN:  Charles A. Delcambre, MDDATE OF BIRTH:  11-Apr-1971   DATE OF ADMISSION:  06/09/2004  DATE OF DISCHARGE:                                HISTORY & PHYSICAL   HISTORY OF PRESENT ILLNESS:  This 40 year old para 0-0-0-0, [facility] July 21, 2004, at 34 weeks and 0 days, presented complaining of contractions q.34m.  She was noted to have cervix 1-2 cm dilated that changed from 1 cm previous  examination.  She was given 20 mg of nifedipine in addition to 10 mg of  nifedipine the previous dose about three hours prior and 10 mg of nifedipine  on home dose q.8h.  She was noncompliant from recommended 20 mg q.8h.  She  did respond but contractions continued moderate q.8-40m.  For this reason  she was admitted for magnesium tocolysis.  She gave informed consent and a  group B Strep was done in MAU.  She denied __________bleeding.  She notes  active fetal movement.   PAST MEDICAL HISTORY:  1.  Noted in this pregnancy, Trichomonas vaginalis treated.  Test negative      Chlamydia.  2.  Cervicitis, treated, __________negative.  3.  Bacterial vaginosis, treated.  4.  Anemia.  Hemoglobin 10.2 on October 17.  5.  UTI, treated.   PAST SURGICAL HISTORY:  Appendectomy.   MEDICATIONS:  Currently prenatal vitamins.   ALLERGIES:  No known drug allergies.   SOCIAL HISTORY:  Less than quarter pack of cigarettes per day at this time/   FAMILY HISTORY:  Noncontributory.   REVIEW OF SYSTEMS:  No fever, chills, nausea, vomiting, diarrhea,  constipation, cough.  Some back pain.  No urgency, frequency, dysuria.  Urinalysis:  The patient claimed negative in MAU today.   PHYSICAL EXAMINATION:  GENERAL:  Alert, oriented x3.  VITAL SIGNS:  Blood pressure reported normal from MAU.  Vital signs stable.  HEENT:  Normal  grossly.  NECK:  Supple without thyromegaly or adenopathy.  LUNGS:  Clear bilaterally.  HEART:  Regular rate and rhythm, 2/6 systolic ejection murmur at left  sternal border.  BREASTS:  Exam deferred.  ABDOMEN:  Soft, gravid, fundal height 34 cm.  PELVIC:  Cervix as noted above.  Not reexamined by me.  EXTREMITIES:  Nontender.  Minimal edema.   ASSESSMENT:  1.  Intrauterine pregnancy, 34 weeks, 0 days.  2.  Preterm labor versus preterm contractions.   PLAN:  Magnesium tocolysis program, low 2 g IV per hour.  Wean off tomorrow  if contractions subside overnight and go back to sustained release  nifedipine, likely 30 mg q.8h.  Continue Indocin until weaned off magnesium  and/or until group B Strep culture is returned.  Discussed Beta Methasone at  34 weeks, soon to be 34/1.  I would lean away from Beta Methasone in that  she is not truly threatening to deliver at this time.  The patient wishes to  not get Beta Methasone as well  and withholds consent after being given  informed consent discussion.  We will send urine for gonorrhea and Chlamydia  testing as well.  Group B Strep is pending.  All questions were answered.  We will proceed as outlined.      CAD/MEDQ  D:  06/09/2004  T:  06/09/2004  Job:  [id number]

## 2010-11-28 NOTE — Consult Note (Signed)
NAME:  EMER, ONNEN NO.:  [id number]   MEDICAL RECORD NO.:  [id number]          PATIENT TYPE:  MAT   LOCATION:  [facility]                          FACILITY:  [facility]   PHYSICIAN:  Juan H. Lily Peer, M.D.DATE OF BIRTH:  1971/02/04   DATE OF CONSULTATION:  DATE OF DISCHARGE:                                   CONSULTATION   COMPLAINT:  Questionable leakage of fluid vaginally.   HISTORY:  The patient is a 40 year old gravida 1, para 0, currently 38 3/[redacted]  weeks gestation, who presented to [facility] this morning complaining  that she had woken up and felt moist and wet and when she came to maternity  admission, Nitrazine and ferning were negative.  She was found to be having  some mild contractions four to five minutes apart, with a reassuring heart  rate tracing.  She had her last visit to the hospital here the first week in  November and last week in October with complaints of possible preterm labor  and at one time, she was given terbutaline and IV hydration and was released  home.  Today, she responded with a single shot of terbutaline 0.25 mg subcu.  A fetal fibronectin test was done, along with a CG and Chlamydia culture,  both pending at the time of this dictation.  Her wet prep demonstrated no  evidence of Trichomoniasis or clue cells or yeast, just a few white blood  cell count.  Her urinalysis was essentially negative with a specific gravity  of 1.020.   PHYSICAL EXAMINATION:  Temperature 97.7, pulse 77,  respirations 20, blood  pressure 113/66.  Fetal heart rate tracing reassuring.  LUNGS:  Clear to auscultation.  HEART:  Regular rate and rhythm, no murmurs or gallops.  BREASTS:  Exam not done.  ABDOMEN:  Soft, nontender.  No rebound or guarding.  PELVIC:  Cervix long, closed, and posterior.  EXTREMITIES:  DTRs 1+.  Negative clonus.  No edema.   ASSESSMENT:  A 40 year old gravida 1, para 0 at 32 3/[redacted] weeks gestation with  premature contractions, a similar  episode in the past.  Will start her on  p.o. tocolysis now that she has responded to the single shot of terbutaline  and she was given a prescription for terbutaline p.o. 2.5 mg to take p.o.  q.4h.  Maintain adequate hydration and rest.  No intercourse this weekend.  Followup with Dr. Sydnee Cabal on Monday, so that he can find out the results  of fetal fibronectin status, as well as the GC and Chlamydia culture.     Juan   JHF/MEDQ  D:  05/24/2004  T:  05/24/2004  Job:  [id number]

## 2011-01-19 ENCOUNTER — Emergency Department (HOSPITAL_COMMUNITY)
Admission: EM | Admit: 2011-01-19 | Discharge: 2011-01-19 | Disposition: A | Discharge status: Home / Self Care | Payer: Self-pay | Attending: Emergency Medicine | Admitting: Emergency Medicine

## 2011-01-19 DIAGNOSIS — X58XXXA Exposure to other specified factors, initial encounter: Secondary | ICD-10-CM | POA: Insufficient documentation

## 2011-01-19 DIAGNOSIS — M545 Low back pain, unspecified: Secondary | ICD-10-CM | POA: Insufficient documentation

## 2011-01-19 DIAGNOSIS — M549 Dorsalgia, unspecified: Secondary | ICD-10-CM | POA: Insufficient documentation

## 2011-01-19 DIAGNOSIS — T148XXA Other injury of unspecified body region, initial encounter: Secondary | ICD-10-CM | POA: Insufficient documentation

## 2011-01-19 LAB — URINALYSIS, ROUTINE W REFLEX MICROSCOPIC
Hgb urine dipstick: NEGATIVE
Ketones, ur: NEGATIVE mg/dL
Specific Gravity, Urine: 1.026 (ref 1.005–1.030)
pH: 6 (ref 5.0–8.0)

## 2011-02-08 ENCOUNTER — Emergency Department (HOSPITAL_COMMUNITY)
Admission: EM | Admit: 2011-02-08 | Discharge: 2011-02-08 | Disposition: A | Discharge status: Home / Self Care | Payer: Self-pay | Attending: Emergency Medicine | Admitting: Emergency Medicine

## 2011-02-08 ENCOUNTER — Emergency Department (HOSPITAL_COMMUNITY): Payer: Self-pay

## 2011-02-08 DIAGNOSIS — Y929 Unspecified place or not applicable: Secondary | ICD-10-CM | POA: Insufficient documentation

## 2011-02-08 DIAGNOSIS — M7989 Other specified soft tissue disorders: Secondary | ICD-10-CM | POA: Insufficient documentation

## 2011-02-08 DIAGNOSIS — S60229A Contusion of unspecified hand, initial encounter: Secondary | ICD-10-CM | POA: Insufficient documentation

## 2011-02-08 DIAGNOSIS — IMO0002 Reserved for concepts with insufficient information to code with codable children: Secondary | ICD-10-CM | POA: Insufficient documentation

## 2011-02-08 DIAGNOSIS — Y9372 Activity, wrestling: Secondary | ICD-10-CM | POA: Insufficient documentation

## 2011-04-02 LAB — URINALYSIS, ROUTINE W REFLEX MICROSCOPIC
Hgb urine dipstick: NEGATIVE
Nitrite: NEGATIVE
Protein, ur: NEGATIVE
Specific Gravity, Urine: 1.03
Urobilinogen, UA: 1

## 2011-04-15 LAB — POCT I-STAT, CHEM 8
Creatinine, Ser: 0.7
HCT: 41
Hemoglobin: 13.9
Potassium: 4.2
Sodium: 138
TCO2: 26

## 2011-04-20 LAB — COMPREHENSIVE METABOLIC PANEL
AST: 19
Albumin: 3.8
Alkaline Phosphatase: 58
BUN: 6
CO2: 22
Chloride: 104
GFR calc Af Amer: >60
GFR calc non Af Amer: >60
Potassium: 3.2 — ABNORMAL LOW
Total Bilirubin: 0.7

## 2011-04-20 LAB — URINALYSIS, ROUTINE W REFLEX MICROSCOPIC
Bilirubin Urine: NEGATIVE
Hgb urine dipstick: NEGATIVE
Ketones, ur: NEGATIVE
Protein, ur: 30 — AB
Urobilinogen, UA: 0.2

## 2011-04-20 LAB — CBC
HCT: 31.8 — ABNORMAL LOW
HCT: 32.1 — ABNORMAL LOW
Platelets: 239
Platelets: 262
RBC: 4.07
RDW: 17.5 — ABNORMAL HIGH
WBC: 7.2
WBC: 7.6

## 2011-04-20 LAB — DIFFERENTIAL
Basophils Absolute: 0
Basophils Absolute: 0.1
Basophils Relative: 1
Eosinophils Relative: 4
Eosinophils Relative: 5
Lymphocytes Relative: 25
Lymphs Abs: 1.8
Monocytes Absolute: 0.7
Neutrophils Relative %: 61

## 2011-04-20 LAB — URINE MICROSCOPIC-ADD ON

## 2011-04-20 LAB — TYPE AND SCREEN
ABO/RH(D): O POS
Antibody Screen: NEGATIVE

## 2011-04-20 LAB — POCT PREGNANCY, URINE
Operator id: 231701
Preg Test, Ur: NEGATIVE

## 2011-04-21 LAB — COMPREHENSIVE METABOLIC PANEL
ALT: 14
AST: 17
Albumin: 4.3
Alkaline Phosphatase: 59
Glucose, Bld: 97
Potassium: 4.6
Sodium: 140
Total Protein: 7.2

## 2011-04-21 LAB — POCT PREGNANCY, URINE
Operator id: 29026
Preg Test, Ur: NEGATIVE

## 2011-04-21 LAB — URINE MICROSCOPIC-ADD ON

## 2011-04-21 LAB — DIFFERENTIAL
Basophils Relative: 1
Eosinophils Absolute: 0.2
Eosinophils Relative: 3
Monocytes Absolute: 0.4
Monocytes Relative: 6

## 2011-04-21 LAB — CBC
Hemoglobin: 10.9 — ABNORMAL LOW
Platelets: 315
RDW: 17.1 — ABNORMAL HIGH

## 2011-04-21 LAB — URINALYSIS, ROUTINE W REFLEX MICROSCOPIC
Glucose, UA: NEGATIVE
Protein, ur: NEGATIVE

## 2011-04-23 LAB — CBC
HCT: 29.4 — ABNORMAL LOW
Hemoglobin: 9.6 — ABNORMAL LOW
RBC: 3.83 — ABNORMAL LOW
RDW: 16.9 — ABNORMAL HIGH
WBC: 10.6 — ABNORMAL HIGH

## 2011-04-23 LAB — DIFFERENTIAL
Basophils Relative: 1
Lymphocytes Relative: 31
Lymphs Abs: 3.3
Monocytes Absolute: 0.7
Monocytes Relative: 7
Neutro Abs: 6.1
Neutrophils Relative %: 57

## 2011-04-23 LAB — BASIC METABOLIC PANEL
Calcium: 8.7
GFR calc Af Amer: >60
GFR calc non Af Amer: >60
Potassium: 3.4 — ABNORMAL LOW
Sodium: 136

## 2011-04-23 LAB — TSH: TSH: 1.778

## 2011-04-24 LAB — D-DIMER, QUANTITATIVE: D-Dimer, Quant: 0.44

## 2011-04-24 LAB — POCT CARDIAC MARKERS
CKMB, poc: <1 — ABNORMAL LOW
Operator id: 3206

## 2011-04-27 LAB — URINALYSIS, ROUTINE W REFLEX MICROSCOPIC
Bilirubin Urine: NEGATIVE
Bilirubin Urine: NEGATIVE
Bilirubin Urine: NEGATIVE
Glucose, UA: NEGATIVE
Glucose, UA: NEGATIVE
Glucose, UA: NEGATIVE
Hgb urine dipstick: NEGATIVE
Hgb urine dipstick: NEGATIVE
Hgb urine dipstick: NEGATIVE
Ketones, ur: NEGATIVE
Ketones, ur: NEGATIVE
Ketones, ur: NEGATIVE
Nitrite: NEGATIVE
Protein, ur: NEGATIVE
Specific Gravity, Urine: 1.02
Urobilinogen, UA: 1
pH: 7
pH: 6.5
pH: 6.5

## 2011-04-27 LAB — CBC
HCT: 31.7 — ABNORMAL LOW
HCT: 32.5 — ABNORMAL LOW
Hemoglobin: 10.3 — ABNORMAL LOW
Hemoglobin: 10.7 — ABNORMAL LOW
Hemoglobin: 10.7 — ABNORMAL LOW
MCHC: 32.8
MCV: 77.4 — ABNORMAL LOW
Platelets: 283
RBC: 4.21
RBC: 4.31
RDW: 16.3 — ABNORMAL HIGH
RDW: 16.5 — ABNORMAL HIGH
WBC: 6.7
WBC: 7.3

## 2011-04-27 LAB — BASIC METABOLIC PANEL
BUN: 3 — ABNORMAL LOW
Chloride: 107
Creatinine, Ser: 0.41
GFR calc non Af Amer: >60
GFR calc non Af Amer: >60
Glucose, Bld: 100 — ABNORMAL HIGH
Potassium: 4.2
Potassium: 4.2
Sodium: 138

## 2011-04-27 LAB — DIFFERENTIAL
Basophils Absolute: 0.1
Basophils Relative: 1
Basophils Relative: 2 — ABNORMAL HIGH
Eosinophils Absolute: 0.3
Eosinophils Relative: 4
Eosinophils Relative: 3
Eosinophils Relative: 5
Lymphocytes Relative: 31
Lymphocytes Relative: 36
Lymphs Abs: 2.1
Lymphs Abs: 2
Lymphs Abs: 2.6
Monocytes Absolute: 0.6
Monocytes Absolute: 0.5
Monocytes Absolute: 0.7
Monocytes Relative: 9
Monocytes Relative: 7
Neutro Abs: 3.7
Neutro Abs: 5.8
Neutrophils Relative %: 50

## 2011-04-27 LAB — COMPREHENSIVE METABOLIC PANEL WITH GFR
AST: 18
Albumin: 4.2
Alkaline Phosphatase: 59
BUN: 6
GFR calc Af Amer: >60
Potassium: 4.3
Total Protein: 7.1

## 2011-04-27 LAB — COMPREHENSIVE METABOLIC PANEL
ALT: 15
ALT: 16
Alkaline Phosphatase: 55
CO2: 26
Calcium: 9.7
Chloride: 108
Creatinine, Ser: 0.5
GFR calc non Af Amer: >60
Glucose, Bld: 96
Glucose, Bld: 98
Potassium: 3.9
Sodium: 138
Sodium: 139
Total Bilirubin: 0.6
Total Protein: 7.4

## 2011-04-27 LAB — LIPASE, BLOOD: Lipase: 22

## 2011-04-27 LAB — POCT CARDIAC MARKERS
CKMB, poc: <1 — ABNORMAL LOW
Troponin i, poc: <0.05

## 2011-04-27 LAB — URINE MICROSCOPIC-ADD ON

## 2011-04-27 LAB — PREGNANCY, URINE: Preg Test, Ur: NEGATIVE

## 2011-04-28 LAB — COMPREHENSIVE METABOLIC PANEL
AST: 16
Albumin: 3.9
Alkaline Phosphatase: 60
BUN: 6
CO2: 29
Calcium: 9.3
Calcium: 9.6
Chloride: 102
Creatinine, Ser: 0.51
GFR calc Af Amer: >60
GFR calc non Af Amer: >60
Glucose, Bld: 102 — ABNORMAL HIGH
Total Bilirubin: 0.8
Total Protein: 6.8
Total Protein: 7.3

## 2011-04-28 LAB — DIFFERENTIAL
Basophils Absolute: 0.1
Basophils Relative: 1
Eosinophils Absolute: 0.2
Eosinophils Relative: 5
Eosinophils Relative: 5
Lymphocytes Relative: 28
Lymphocytes Relative: 38
Lymphs Abs: 1.9
Lymphs Abs: 2.4
Lymphs Abs: 3
Monocytes Absolute: 0.5
Monocytes Absolute: 0.7
Monocytes Relative: 6
Monocytes Relative: 8
Monocytes Relative: 9
Neutro Abs: 3.8
Neutro Abs: 4
Neutrophils Relative %: 59

## 2011-04-28 LAB — CBC
HCT: 30.5 — ABNORMAL LOW
HCT: 30.6 — ABNORMAL LOW
Hemoglobin: 10.1 — ABNORMAL LOW
Hemoglobin: 9.7 — ABNORMAL LOW
MCHC: 31.7
MCHC: 33.2
MCV: 77.3 — ABNORMAL LOW
MCV: 78.3
MCV: 79.1
Platelets: 311
Platelets: 326
RBC: 3.85 — ABNORMAL LOW
RBC: 3.96
RDW: 16.6 — ABNORMAL HIGH
RDW: 16.8 — ABNORMAL HIGH
RDW: 17 — ABNORMAL HIGH
WBC: 8
WBC: 8.6

## 2011-04-28 LAB — COMPREHENSIVE METABOLIC PANEL WITH GFR
ALT: 14
AST: 20
Albumin: 3.8
Alkaline Phosphatase: 54
BUN: 3 — ABNORMAL LOW
CO2: 25
Calcium: 9.2
Chloride: 100
Creatinine, Ser: 0.48
GFR calc non Af Amer: >60
Glucose, Bld: 96
Potassium: 4.2
Sodium: 132 — ABNORMAL LOW
Total Bilirubin: 0.7
Total Protein: 6.9

## 2011-04-28 LAB — LIPASE, BLOOD
Lipase: 24
Lipase: 27

## 2011-04-28 LAB — URINE MICROSCOPIC-ADD ON

## 2011-04-28 LAB — URINALYSIS, ROUTINE W REFLEX MICROSCOPIC
Glucose, UA: NEGATIVE
Ketones, ur: NEGATIVE
Leukocytes, UA: NEGATIVE
Nitrite: NEGATIVE
Protein, ur: NEGATIVE

## 2011-04-28 LAB — PREGNANCY, URINE: Preg Test, Ur: NEGATIVE

## 2011-04-30 LAB — POCT PREGNANCY, URINE
Operator id: 189501
Preg Test, Ur: NEGATIVE

## 2011-04-30 LAB — URINALYSIS, ROUTINE W REFLEX MICROSCOPIC
Glucose, UA: NEGATIVE
Glucose, UA: NEGATIVE
Nitrite: NEGATIVE
Protein, ur: NEGATIVE
Specific Gravity, Urine: 1.006
Specific Gravity, Urine: 1.024
Urobilinogen, UA: 0.2
pH: 7

## 2011-04-30 LAB — URINE MICROSCOPIC-ADD ON

## 2015-07-14 DIAGNOSIS — G459 Transient cerebral ischemic attack, unspecified: Secondary | ICD-10-CM

## 2015-07-14 HISTORY — DX: Transient cerebral ischemic attack, unspecified: G45.9

## 2016-10-11 HISTORY — PX: CARDIAC SURGERY: SHX584

## 2018-09-20 ENCOUNTER — Other Ambulatory Visit: Payer: Self-pay

## 2018-09-20 ENCOUNTER — Encounter (HOSPITAL_BASED_OUTPATIENT_CLINIC_OR_DEPARTMENT_OTHER): Payer: Self-pay

## 2018-09-20 ENCOUNTER — Emergency Department (HOSPITAL_BASED_OUTPATIENT_CLINIC_OR_DEPARTMENT_OTHER)
Admission: EM | Admit: 2018-09-20 | Discharge: 2018-09-20 | Disposition: A | Discharge status: Home / Self Care | Payer: Self-pay | Attending: Emergency Medicine | Admitting: Emergency Medicine

## 2018-09-20 ENCOUNTER — Emergency Department (HOSPITAL_BASED_OUTPATIENT_CLINIC_OR_DEPARTMENT_OTHER): Payer: Self-pay

## 2018-09-20 DIAGNOSIS — M25561 Pain in right knee: Secondary | ICD-10-CM

## 2018-09-20 DIAGNOSIS — Y999 Unspecified external cause status: Secondary | ICD-10-CM | POA: Insufficient documentation

## 2018-09-20 DIAGNOSIS — Z8673 Personal history of transient ischemic attack (TIA), and cerebral infarction without residual deficits: Secondary | ICD-10-CM | POA: Insufficient documentation

## 2018-09-20 DIAGNOSIS — E119 Type 2 diabetes mellitus without complications: Secondary | ICD-10-CM | POA: Insufficient documentation

## 2018-09-20 DIAGNOSIS — Y9289 Other specified places as the place of occurrence of the external cause: Secondary | ICD-10-CM | POA: Insufficient documentation

## 2018-09-20 DIAGNOSIS — S83206A Unspecified tear of unspecified meniscus, current injury, right knee, initial encounter: Secondary | ICD-10-CM | POA: Insufficient documentation

## 2018-09-20 DIAGNOSIS — X58XXXA Exposure to other specified factors, initial encounter: Secondary | ICD-10-CM | POA: Insufficient documentation

## 2018-09-20 DIAGNOSIS — Y9389 Activity, other specified: Secondary | ICD-10-CM | POA: Insufficient documentation

## 2018-09-20 HISTORY — DX: Type 2 diabetes mellitus without complications: E11.9

## 2018-09-20 HISTORY — DX: Transient cerebral ischemic attack, unspecified: G45.9

## 2018-09-20 HISTORY — DX: Unspecified atrial fibrillation: I48.91

## 2018-09-20 MED ORDER — IBUPROFEN 600 MG PO TABS: 600.0000 mg | ORAL_TABLET | Freq: Four times a day (QID) | ORAL | 0 refills | Status: DC | PRN

## 2018-09-20 MED ORDER — DIAZEPAM 5 MG PO TABS
5.0000 mg | ORAL_TABLET | Freq: Once | ORAL | Status: AC
  Administered 2018-09-20: 5 mg via ORAL
  Filled 2018-09-20: qty 1

## 2018-09-20 MED ORDER — HYDROCODONE-ACETAMINOPHEN 5-325 MG PO TABS
1.0000 | ORAL_TABLET | Freq: Once | ORAL | Status: AC
  Administered 2018-09-20: 1 via ORAL
  Filled 2018-09-20: qty 1

## 2018-09-20 MED ORDER — ONDANSETRON 4 MG PO TBDP
4.0000 mg | ORAL_TABLET | Freq: Once | ORAL | Status: AC
  Administered 2018-09-20: 4 mg via ORAL
  Filled 2018-09-20: qty 1

## 2018-09-20 MED ORDER — MORPHINE SULFATE (PF) 4 MG/ML IV SOLN
4.0000 mg | Freq: Once | INTRAVENOUS | Status: AC
  Administered 2018-09-20: 4 mg via INTRAMUSCULAR
  Filled 2018-09-20: qty 1

## 2018-09-20 MED ORDER — HYDROCODONE-ACETAMINOPHEN 5-325 MG PO TABS: 1.0000 | ORAL_TABLET | ORAL | 0 refills | Status: DC | PRN

## 2018-09-20 NOTE — Discharge Instructions (Addendum)
You were seen today for pain in your right knee. Please read and follow all provided instructions.    1. Medications: alternate ibuprofen and tylenol for pain control,  continue usual home medications  2. Treatment: rest, ice, elevate and use brace, drink plenty of fluids, gentle stretching.  Wear the knee immobilizer and use crutches until follow-up with orthopedics.  3. Follow Up: Please followup with orthopedics as directed or your PCP in 1 week if no improvement for discussion of your diagnoses and further evaluation after today's visit; if you do not have a primary care doctor use the resource guide provided to find one; Please return to the ER for worsening symptoms or other concerns

## 2018-09-20 NOTE — ED Triage Notes (Signed)
Pt states she was sitting at her desk and her right knee locked up and now she cannot straighten it

## 2018-09-20 NOTE — ED Provider Notes (Signed)
[facility] EMERGENCY DEPARTMENT Provider Note   CSN: [id number] Arrival date & time: 09/20/18  1824    History   Chief Complaint Chief Complaint  Patient presents with  . Knee Pain    HPI Tina [name] is a 48 y.o. female with history of A. fib, diabetes, TIA presenting to emergency department today with chief complaint of right knee pain.  Onset was acute starting patient 4 hours prior to arrival.  Patient states when she woke up this morning she had an aching pain in her right knee.  The pain did not radiate and she did not think anything of it.  Later in the day while sitting at her desk at work patient states she went to stand up and felt her knee locked.  She is unable to extend the knee.  She describes the pain in her knee as sharp and constant.  She rates the pain 10 out of 10 in severity.  The pain is at her kneecap and radiates up and down her leg.  Patient has a history of similar problem.  She states last on this happened was 4 years ago.   Denies recent injury, fall, fever, chills, numbness, weakness, tingling, back pain. History provided by patient.      Past Medical History:  Diagnosis Date  . Atrial fibrillation (HCC)   . Diabetes mellitus without complication (HCC)   . TIA (transient ischemic attack) 2017    There are no active problems to display for this patient.   Past Surgical History:  Procedure Laterality Date  . CARDIAC SURGERY  10/2016   repair PFO     OB History   No obstetric history on file.      Home Medications    Prior to Admission medications   Not on File    Family History No family history on file.  Social History Social History   Tobacco Use  . Smoking status: Not on file  Substance Use Topics  . Alcohol use: Not on file  . Drug use: Not on file     Allergies   Patient has no known allergies.   Review of Systems Review of Systems  Musculoskeletal: Positive for arthralgias and gait problem. Negative  for back pain, joint swelling, myalgias and neck pain.  Skin: Negative for rash and wound.  All other systems reviewed and are negative.    Physical Exam Updated Vital Signs BP (!) 141/94 (BP Location: Right Arm)   Pulse 93   Temp 97.6 F (36.4 C) (Oral)   Resp 18   Ht 5\' 7"  (1.702 m)   Wt 63.5 kg   SpO2 100%   BMI 21.93 kg/m   Physical Exam Vitals signs and nursing note reviewed.  Constitutional:      Appearance: She is well-developed. She is not toxic-appearing.  HENT:     Head: Normocephalic and atraumatic.     Nose: Nose normal.  Eyes:     General: No scleral icterus.       Right eye: No discharge.        Left eye: No discharge.     Conjunctiva/sclera: Conjunctivae normal.  Neck:     Musculoskeletal: Normal range of motion. No muscular tenderness.  Cardiovascular:     Rate and Rhythm: Normal rate and regular rhythm.     Pulses: Normal pulses.          Radial pulses are 2+ on the right side and 2+ on the left side.  Dorsalis pedis pulses are 2+ on the right side and 2+ on the left side.     Heart sounds: Normal heart sounds.  Pulmonary:     Effort: Pulmonary effort is normal.     Breath sounds: Normal breath sounds.  Abdominal:     General: There is no distension.     Palpations: Abdomen is soft.  Musculoskeletal: Normal range of motion.     Right knee: She exhibits abnormal patellar mobility and bony tenderness. She exhibits normal range of motion, no swelling, no ecchymosis, no deformity, no erythema and normal alignment.     Comments: Right knee is flexed to 45 degrees.  Patient is unable to straighten knee.  Tender to palpation of patella.  No surrounding erythema or swelling.  Skin:    General: Skin is warm and dry.     Capillary Refill: Capillary refill takes less than 2 seconds.  Neurological:     Mental Status: She is oriented to person, place, and time.     Comments: Fluent speech, no facial droop. Decreased strength in left lower extremity  secondary to pain.  Patient able to dorsiflex and plantar flex left foot.   Sensation normal to light and sharp touch in bilateral lower extremities.     Psychiatric:        Behavior: Behavior normal.      ED Treatments / Results  Labs (all labs ordered are listed, but only abnormal results are displayed) Labs Reviewed - No data to display  EKG None  Radiology Dg Knee 2 Views Right  Result Date: 09/20/2018 CLINICAL DATA:  Unable to move right knee. EXAM: RIGHT KNEE - 1-2 VIEW COMPARISON:  None. FINDINGS: The right knee is locked in a flexed position. There is no fracture or dislocation. No joint effusion. IMPRESSION: Right knee locked in flexed position, which may be caused by a displaced meniscal tear. No osseous abnormality. Electronically Signed   By: Deatra Robinson M.D.   On: 09/20/2018 20:08    Procedures Procedures (including critical care time)  Medications Ordered in ED Medications  HYDROcodone-acetaminophen (NORCO/VICODIN) 5-325 MG per tablet 1 tablet (1 tablet Oral Given 09/20/18 1916)     Initial Impression / Assessment and Plan / ED Course  I have reviewed the triage vital signs and the nursing notes.  Pertinent labs & imaging results that were available during my care of the patient were reviewed by me and considered in my medical decision making (see chart for details).    Patient presents to the ED with complaints of pain to the right knee without previous injury.  Patient has history of similar problem stating this last happened 4 years ago.  Her knee locks and she is unable to straighten her leg without significant pain.. Exam without obvious deformity or open wounds. Pt's right leg is locked at 45 degree flexion. to 45 degrees. Tender to palpation over patella.Marland Kitchen NVI distally. Xray negative for fracture/dislocation.   At shift change care was transferred to Dr. Particia Nearing who will follow pending studies, re-evaulate and determine disposition.  Pt will likely be  discharged home with knee immobilizer and crutches and recommend orthopedics follow upl  This note was prepared with assistance of Dragon voice recognition software. Occasional wrong-word or sound-a-like substitutions may have occurred due to the inherent limitations of voice recognition software.       Final Clinical Impressions(s) / ED Diagnoses   Final diagnoses:  None    ED Discharge Orders    None  Kathyrn Lass 09/20/18 2043    Jacalyn Lefevre, MD 09/20/18 2112

## 2019-04-30 ENCOUNTER — Emergency Department (HOSPITAL_BASED_OUTPATIENT_CLINIC_OR_DEPARTMENT_OTHER)
Admission: EM | Admit: 2019-04-30 | Discharge: 2019-04-30 | Disposition: A | Discharge status: Home / Self Care | Payer: Self-pay | Attending: Emergency Medicine | Admitting: Emergency Medicine

## 2019-04-30 ENCOUNTER — Other Ambulatory Visit: Payer: Self-pay

## 2019-04-30 ENCOUNTER — Encounter (HOSPITAL_BASED_OUTPATIENT_CLINIC_OR_DEPARTMENT_OTHER): Payer: Self-pay | Admitting: Emergency Medicine

## 2019-04-30 DIAGNOSIS — F17210 Nicotine dependence, cigarettes, uncomplicated: Secondary | ICD-10-CM | POA: Insufficient documentation

## 2019-04-30 DIAGNOSIS — Z79899 Other long term (current) drug therapy: Secondary | ICD-10-CM | POA: Insufficient documentation

## 2019-04-30 DIAGNOSIS — L02211 Cutaneous abscess of abdominal wall: Secondary | ICD-10-CM | POA: Insufficient documentation

## 2019-04-30 DIAGNOSIS — E119 Type 2 diabetes mellitus without complications: Secondary | ICD-10-CM | POA: Insufficient documentation

## 2019-04-30 DIAGNOSIS — L0291 Cutaneous abscess, unspecified: Secondary | ICD-10-CM

## 2019-04-30 MED ORDER — CLINDAMYCIN HCL 300 MG PO CAPS: 300.0000 mg | ORAL_CAPSULE | Freq: Three times a day (TID) | ORAL | 0 refills | Status: AC

## 2019-04-30 NOTE — ED Triage Notes (Signed)
Pt has a large bump with erythema on LUQ of abdomen x 1 week. Has gotten progressively worse and painful with time. No drainage.

## 2019-04-30 NOTE — Discharge Instructions (Addendum)
I have prescribed antibiotics to help with your infection.  You may also apply warm compresses to this area, please be aware you will need to recheck your wound daily, if this expands outside of the outline you will need to return to the emergency department.  You may alternate Tylenol or ibuprofen to help with your symptoms.

## 2019-04-30 NOTE — ED Provider Notes (Signed)
[facility] EMERGENCY DEPARTMENT Provider Note   CSN: [id number] Arrival date & time: 04/30/19  1040     History   Chief Complaint Chief Complaint  Patient presents with  . Abscess    HPI Tina [name] is a 48 y.o. female.     48 y.o female with a PMH of DM, Afib presents to the ED with a chief complaint of left upper quadrant abscess x 1 week.  Patient reports she first noted a blackhead to the left upper quadrant, reports she noted increasing swelling to the area along with some mild redness.  Reports is is tender to palpation.  She has not tried any over-the-counter therapy for this.  Has not had a prior episode to this.  She denies any fevers, chills, abdominal pain, nausea or vomiting.  The history is provided by the patient.  Abscess Associated symptoms: no fever     Past Medical History:  Diagnosis Date  . Atrial fibrillation (HCC)   . Diabetes mellitus without complication (HCC)   . TIA (transient ischemic attack) 2017    There are no active problems to display for this patient.   Past Surgical History:  Procedure Laterality Date  . CARDIAC SURGERY  10/2016   repair PFO     OB History   No obstetric history on file.      Home Medications    Prior to Admission medications   Medication Sig Start Date End Date Taking? Authorizing Provider  clindamycin (CLEOCIN) 300 MG capsule Take 1 capsule (300 mg total) by mouth 3 (three) times daily for 7 days. 04/30/19 05/07/19  Claude Manges, PA-C  HYDROcodone-acetaminophen (NORCO/VICODIN) 5-325 MG tablet Take 1 tablet by mouth every 4 (four) hours as needed. 09/20/18   Jacalyn Lefevre, MD  ibuprofen (ADVIL,MOTRIN) 600 MG tablet Take 1 tablet (600 mg total) by mouth every 6 (six) hours as needed. 09/20/18   Jacalyn Lefevre, MD    Family History History reviewed. No pertinent family history.  Social History Social History   Tobacco Use  . Smoking status: Current Every Day Smoker    Packs/day: 0.25     Years: 6.00    Pack years: 1.50    Types: Cigarettes  . Smokeless tobacco: Never Used  Substance Use Topics  . Alcohol use: Not Currently  . Drug use: Never     Allergies   Patient has no known allergies.   Review of Systems Review of Systems  Constitutional: Negative for fever.  Skin: Positive for wound.     Physical Exam Updated Vital Signs BP (!) 154/93 (BP Location: Left Arm)   Pulse 80   Temp 98.3 F (36.8 C) (Oral)   Resp 18   Ht 5\' 7"  (1.702 m)   Wt 63.5 kg   SpO2 99%   BMI 21.93 kg/m   Physical Exam Vitals signs and nursing note reviewed.  Constitutional:      General: She is not in acute distress.    Appearance: She is well-developed.     Comments: Nontoxic-appearing.  HENT:     Head: Normocephalic and atraumatic.     Mouth/Throat:     Pharynx: No oropharyngeal exudate.  Eyes:     Pupils: Pupils are equal, round, and reactive to light.  Neck:     Musculoskeletal: Normal range of motion.  Cardiovascular:     Rate and Rhythm: Regular rhythm.     Heart sounds: Normal heart sounds.  Pulmonary:     Effort: Pulmonary effort is  normal. No respiratory distress.     Breath sounds: Normal breath sounds.  Abdominal:     General: Bowel sounds are normal. There is no distension.     Palpations: Abdomen is soft.     Tenderness: There is no abdominal tenderness. There is no right CVA tenderness or left CVA tenderness.     Comments: 3 x 3 cm indurated region, no fluctuance on palpation.  No abdominal tenderness aside from focal to the wound.  Musculoskeletal:        General: No tenderness or deformity.     Right lower leg: No edema.     Left lower leg: No edema.  Skin:    General: Skin is warm and dry.     Comments: Please see photos attached.  Neurological:     Mental Status: She is alert and oriented to person, place, and time.            ED Treatments / Results  Labs (all labs ordered are listed, but only abnormal results are displayed)  Labs Reviewed - No data to display  EKG None  Radiology No results found.  Procedures Procedures (including critical care time)  Medications Ordered in ED Medications - No data to display   Initial Impression / Assessment and Plan / ED Course  I have reviewed the triage vital signs and the nursing notes.  Pertinent labs & imaging results that were available during my care of the patient were reviewed by me and considered in my medical decision making (see chart for details).       With a past medical history of diabetes, A. fib presents to the ED with complaints of abscess.  Patient reports that been going on for the past week, she noted a blackhead noted to the area on the left upper quadrant.  She does not have any abdominal pain, fevers, nausea, vomiting.  Vitals are within normal limits during her visit in the ED.  Abdomen was examined with proper bowel sounds, there is induration in the skin to the left upper quadrant, minimally fluctuant, discussed risks and benefits of I&D at this time with patient, patient deferred will be placed on antibiotics to treat for her abscess, advised to apply warm compresses to the area to help with her symptoms.  No changes in the skin consistent with any cellulitis at this time, she is nontoxic-appearing, her vitals are within normal limits.  Suspect we can treat this with an outpatient course of antibiotics, pictures are attached to the chart, have outlined at her abscess please review these.  Patient understands and agrees with management, will go home on a course of Idamycin to treat for any MRSA.  Patient stable for discharge.  Portions of this note were generated with [employer]. Dictation errors may occur despite best attempts at proofreading. Final Clinical Impressions(s) / ED Diagnoses   Final diagnoses:  Abscess    ED Discharge Orders         Ordered    clindamycin (CLEOCIN) 300 MG capsule  3 times daily     04/30/19  1127           Janeece Fitting, PA-C 04/30/19 1129    Hayden Rasmussen, MD 04/30/19 1651

## 2019-04-30 NOTE — ED Notes (Signed)
ED Provider at bedside. 

## 2019-05-15 ENCOUNTER — Emergency Department (HOSPITAL_BASED_OUTPATIENT_CLINIC_OR_DEPARTMENT_OTHER)
Admission: EM | Admit: 2019-05-15 | Discharge: 2019-05-15 | Disposition: A | Discharge status: Home / Self Care | Payer: Self-pay | Attending: Emergency Medicine | Admitting: Emergency Medicine

## 2019-05-15 ENCOUNTER — Other Ambulatory Visit: Payer: Self-pay

## 2019-05-15 ENCOUNTER — Ambulatory Visit (HOSPITAL_COMMUNITY)
Admission: EM | Admit: 2019-05-15 | Discharge: 2019-05-15 | Disposition: A | Discharge status: Home / Self Care | Payer: Self-pay | Attending: Emergency Medicine | Admitting: Emergency Medicine

## 2019-05-15 ENCOUNTER — Encounter (HOSPITAL_COMMUNITY): Payer: Self-pay | Admitting: Emergency Medicine

## 2019-05-15 ENCOUNTER — Encounter (HOSPITAL_BASED_OUTPATIENT_CLINIC_OR_DEPARTMENT_OTHER): Payer: Self-pay | Admitting: *Deleted

## 2019-05-15 DIAGNOSIS — R05 Cough: Secondary | ICD-10-CM | POA: Insufficient documentation

## 2019-05-15 DIAGNOSIS — Z20828 Contact with and (suspected) exposure to other viral communicable diseases: Secondary | ICD-10-CM | POA: Insufficient documentation

## 2019-05-15 DIAGNOSIS — R030 Elevated blood-pressure reading, without diagnosis of hypertension: Secondary | ICD-10-CM

## 2019-05-15 DIAGNOSIS — R059 Cough, unspecified: Secondary | ICD-10-CM

## 2019-05-15 DIAGNOSIS — Z5321 Procedure and treatment not carried out due to patient leaving prior to being seen by health care provider: Secondary | ICD-10-CM | POA: Insufficient documentation

## 2019-05-15 DIAGNOSIS — Z20822 Contact with and (suspected) exposure to covid-19: Secondary | ICD-10-CM

## 2019-05-15 MED ORDER — GUAIFENESIN ER 600 MG PO TB12: 1200.0000 mg | ORAL_TABLET | Freq: Two times a day (BID) | ORAL | 0 refills | Status: DC | PRN

## 2019-05-15 MED ORDER — FLUTICASONE PROPIONATE 50 MCG/ACT NA SUSP: 1.0000 | Freq: Every day | NASAL | 0 refills | Status: DC

## 2019-05-15 NOTE — ED Triage Notes (Signed)
Cough x 3 days

## 2019-05-15 NOTE — ED Triage Notes (Signed)
Pt c/o cough and chest congestion since Friday. Pt does not want covid testing.

## 2019-05-15 NOTE — ED Provider Notes (Signed)
[facility]    CSN: [id number] Arrival date & time: 05/15/19  1313      History   Chief Complaint No chief complaint on file.   HPI Tina [name] is a 48 y.o. female.   Patient presents with nonproductive cough, sinus drainage, and chest congestion x4 days.  She denies fever, chills, sore throat, chest pain, shortness of breath, abdominal pain, vomiting, diarrhea, or other symptoms.  No treatments attempted at home.  Her medical history is significant for diabetes, TIA, A-fib.  The history is provided by the patient.    Past Medical History:  Diagnosis Date  . Atrial fibrillation (HCC)   . Diabetes mellitus without complication (HCC)   . TIA (transient ischemic attack) 2017    There are no active problems to display for this patient.   Past Surgical History:  Procedure Laterality Date  . CARDIAC SURGERY  10/2016   repair PFO    OB History   No obstetric history on file.      Home Medications    Prior to Admission medications   Medication Sig Start Date End Date Taking? Authorizing Provider  fluticasone (FLONASE) 50 MCG/ACT nasal spray Place 1 spray into both nostrils daily. 05/15/19   Mickie Bail, NP  guaiFENesin (MUCINEX) 600 MG 12 hr tablet Take 2 tablets (1,200 mg total) by mouth 2 (two) times daily as needed. 05/15/19   Mickie Bail, NP  HYDROcodone-acetaminophen (NORCO/VICODIN) 5-325 MG tablet Take 1 tablet by mouth every 4 (four) hours as needed. 09/20/18   Jacalyn Lefevre, MD  ibuprofen (ADVIL,MOTRIN) 600 MG tablet Take 1 tablet (600 mg total) by mouth every 6 (six) hours as needed. 09/20/18   Jacalyn Lefevre, MD    Family History No family history on file.  Social History Social History   Tobacco Use  . Smoking status: Current Every Day Smoker    Packs/day: 0.25    Years: 6.00    Pack years: 1.50    Types: Cigarettes  . Smokeless tobacco: Never Used  Substance Use Topics  . Alcohol use: Not Currently  . Drug use: Never      Allergies   Patient has no known allergies.   Review of Systems Review of Systems  Constitutional: Negative for chills and fever.  HENT: Positive for congestion, postnasal drip and rhinorrhea. Negative for ear pain and sore throat.   Eyes: Negative for pain and visual disturbance.  Respiratory: Positive for cough. Negative for shortness of breath.   Cardiovascular: Negative for chest pain and palpitations.  Gastrointestinal: Negative for abdominal pain and vomiting.  Genitourinary: Negative for dysuria and hematuria.  Musculoskeletal: Negative for arthralgias and back pain.  Skin: Negative for color change and rash.  Neurological: Negative for seizures and syncope.  All other systems reviewed and are negative.    Physical Exam Triage Vital Signs ED Triage Vitals  Enc Vitals Group     BP      Pulse      Resp      Temp      Temp src      SpO2      Weight      Height      Head Circumference      Peak Flow      Pain Score      Pain Loc      Pain Edu?      Excl. in GC?    No data found.  Updated Vital Signs BP Marland Kitchen)  145/93   Pulse (!) 105   Temp 97.8 F (36.6 C)   Resp 18   SpO2 97%   Visual Acuity Right Eye Distance:   Left Eye Distance:   Bilateral Distance:    Right Eye Near:   Left Eye Near:    Bilateral Near:     Physical Exam Vitals signs and nursing note reviewed.  Constitutional:      General: She is not in acute distress.    Appearance: She is well-developed.  HENT:     Head: Normocephalic and atraumatic.     Right Ear: Tympanic membrane normal.     Left Ear: Tympanic membrane normal.     Nose: Congestion and rhinorrhea present.     Mouth/Throat:     Mouth: Mucous membranes are moist.     Pharynx: Oropharynx is clear.  Eyes:     Conjunctiva/sclera: Conjunctivae normal.  Neck:     Musculoskeletal: Neck supple.  Cardiovascular:     Rate and Rhythm: Normal rate and regular rhythm.     Heart sounds: No murmur.  Pulmonary:     Effort:  Pulmonary effort is normal. No respiratory distress.     Breath sounds: Normal breath sounds. No wheezing or rhonchi.     Comments: Coughing throughout exam. Abdominal:     General: Bowel sounds are normal.     Palpations: Abdomen is soft.     Tenderness: There is no abdominal tenderness. There is no guarding or rebound.  Skin:    General: Skin is warm and dry.     Findings: No rash.  Neurological:     Mental Status: She is alert.      UC Treatments / Results  Labs (all labs ordered are listed, but only abnormal results are displayed) Labs Reviewed  NOVEL CORONAVIRUS, NAA (HOSP ORDER, SEND-OUT TO REF LAB; TAT 18-24 HRS)    EKG   Radiology No results found.  Procedures Procedures (including critical care time)  Medications Ordered in UC Medications - No data to display  Initial Impression / Assessment and Plan / UC Course  I have reviewed the triage vital signs and the nursing notes.  Pertinent labs & imaging results that were available during my care of the patient were reviewed by me and considered in my medical decision making (see chart for details).    Cough, suspected Covid.  Elevated blood pressure.  Treating with Mucinex and Flonase.  Discussed with patient that her blood pressure is elevated today and needs to be rechecked by her PCP in 2 to 4 weeks.  Patient states she does not have a PCP; suggestion given to patient.  COVID test performed here.  Instructed patient to self quarantine until the test result is back.  Instructed patient to go to the emergency department if she develops high fever, shortness of breath, severe diarrhea, or other concerning symptoms.  Patient agrees with plan of care.   Final Clinical Impressions(s) / UC Diagnoses   Final diagnoses:  Cough  Elevated blood pressure reading  Suspected COVID-19 virus infection     Discharge Instructions     Take the Mucinex and use the Flonase nasal spray as directed.    Your COVID test is  pending.  You should self quarantine until your test result is back and is negative.    Go to the emergency department if you develop high fever, shortness of breath, severe diarrhea, or other concerning symptoms.    Your blood pressure is elevated today  at 145/93.  Please have this rechecked by your primary care provider in 2 weeks.  If you do not have a primary care provider, one is suggested below.       ED Prescriptions    Medication Sig Dispense Auth. Provider   fluticasone (FLONASE) 50 MCG/ACT nasal spray Place 1 spray into both nostrils daily. 16 g Sharion Balloon, NP   guaiFENesin (MUCINEX) 600 MG 12 hr tablet Take 2 tablets (1,200 mg total) by mouth 2 (two) times daily as needed. 12 tablet Sharion Balloon, NP     PDMP not reviewed this encounter.   Sharion Balloon, NP 05/15/19 1359

## 2019-05-15 NOTE — Discharge Instructions (Addendum)
Take the Mucinex and use the Flonase nasal spray as directed.    Your COVID test is pending.  You should self quarantine until your test result is back and is negative.    Go to the emergency department if you develop high fever, shortness of breath, severe diarrhea, or other concerning symptoms.    Your blood pressure is elevated today at 145/93.  Please have this rechecked by your primary care provider in 2 weeks.  If you do not have a primary care provider, one is suggested below.

## 2019-05-16 LAB — NOVEL CORONAVIRUS, NAA (HOSP ORDER, SEND-OUT TO REF LAB; TAT 18-24 HRS): SARS-CoV-2, NAA: NOT DETECTED

## 2019-08-08 ENCOUNTER — Ambulatory Visit (HOSPITAL_COMMUNITY)
Admission: EM | Admit: 2019-08-08 | Discharge: 2019-08-08 | Disposition: A | Discharge status: Home / Self Care | Payer: HRSA Program

## 2019-08-08 ENCOUNTER — Other Ambulatory Visit: Payer: Self-pay

## 2019-08-08 ENCOUNTER — Encounter (HOSPITAL_COMMUNITY): Payer: Self-pay

## 2019-08-08 DIAGNOSIS — U071 COVID-19: Secondary | ICD-10-CM

## 2019-08-08 DIAGNOSIS — E119 Type 2 diabetes mellitus without complications: Secondary | ICD-10-CM | POA: Diagnosis not present

## 2019-08-08 DIAGNOSIS — Z20822 Contact with and (suspected) exposure to covid-19: Secondary | ICD-10-CM

## 2019-08-08 MED ORDER — BENZONATATE 100 MG PO CAPS: 100.0000 mg | ORAL_CAPSULE | Freq: Three times a day (TID) | ORAL | 0 refills | Status: DC

## 2019-08-08 MED ORDER — SALINE SPRAY 0.65 % NA SOLN: 2.0000 | NASAL | 0 refills | Status: DC | PRN

## 2019-08-08 MED ORDER — GUAIFENESIN ER 600 MG PO TB12: 600.0000 mg | ORAL_TABLET | Freq: Two times a day (BID) | ORAL | 0 refills | Status: DC | PRN

## 2019-08-08 NOTE — ED Provider Notes (Signed)
[name]    CSN: [id number] Arrival date & time: 08/08/19  1847      History   Chief Complaint Chief Complaint  Patient presents with  . Nasal Congestion    HPI Tina [name] is a 49 y.o. female.   Patient reports to urgent care today for 4 days of nasal congestion and loss of smell. She notes yellow nasal discharge and stuffiness. Sense of smell and taste as changed and is absent at times. Endorses a mild cough with clear sputum. She denies shortness of breath or chest pain. Denies headache, sore throat, nausea, vomiting, diarrhea.   She has not taken anything for the symptoms. She has had no sick contacts.      Past Medical History:  Diagnosis Date  . Atrial fibrillation ([place])   . Diabetes mellitus without complication ([place])   . TIA (transient ischemic attack) 2017    There are no problems to display for this patient.   Past Surgical History:  Procedure Laterality Date  . CARDIAC SURGERY  10/2016   repair PFO    OB History   No obstetric history on file.      Home Medications    Prior to Admission medications   Medication Sig Start Date End Date Taking? Authorizing Provider  Multiple Vitamins-Minerals (MULTIVITAMIN ADULTS PO) Take by mouth.   Yes [provider]  benzonatate (TESSALON) 100 MG capsule Take 1 capsule (100 mg total) by mouth every 8 (eight) hours. 08/08/19   Cebert Dettmann, Marguerita Beards, PA-C  fluticasone (FLONASE) 50 MCG/ACT nasal spray Place 1 spray into both nostrils daily. 05/15/19   Sharion Balloon, NP  guaiFENesin (MUCINEX) 600 MG 12 hr tablet Take 1 tablet (600 mg total) by mouth 2 (two) times daily as needed. 08/08/19   Jakie Debow, Marguerita Beards, PA-C  HYDROcodone-acetaminophen (NORCO/VICODIN) 5-325 MG tablet Take 1 tablet by mouth every 4 (four) hours as needed. 09/20/18   Isla Pence, MD  ibuprofen (ADVIL,MOTRIN) 600 MG tablet Take 1 tablet (600 mg total) by mouth every 6 (six) hours as needed. 09/20/18   Isla Pence, MD  sodium  chloride (OCEAN) 0.65 % SOLN nasal spray Place 2 sprays into both nostrils as needed for congestion. 08/08/19   Aiva Miskell, Marguerita Beards, PA-C    Family History History reviewed. No pertinent family history.  Social History Social History   Tobacco Use  . Smoking status: Current Every Day Smoker    Packs/day: 0.25    Years: 6.00    Pack years: 1.50    Types: Cigarettes  . Smokeless tobacco: Never Used  Substance Use Topics  . Alcohol use: Not Currently  . Drug use: Never     Allergies   Patient has no known allergies.   Review of Systems Review of Systems  Constitutional: Negative for chills, fatigue and fever.  HENT: Positive for congestion, postnasal drip and rhinorrhea. Negative for ear pain, sinus pressure, sinus pain and sore throat.   Eyes: Negative for pain and visual disturbance.  Respiratory: Positive for cough. Negative for shortness of breath.   Cardiovascular: Negative for chest pain and palpitations.  Gastrointestinal: Negative for abdominal pain, diarrhea, nausea and vomiting.  Genitourinary: Negative for dysuria and hematuria.  Musculoskeletal: Negative for arthralgias, back pain and myalgias.  Skin: Negative for color change and rash.  Neurological: Negative for seizures, syncope and headaches.  All other systems reviewed and are negative.    Physical Exam Triage Vital Signs ED Triage Vitals  Enc Vitals Group  BP      Pulse      Resp      Temp      Temp src      SpO2      Weight      Height      Head Circumference      Peak Flow      Pain Score      Pain Loc      Pain Edu?      Excl. in [place]?    No data found.  Updated Vital Signs BP (!) 140/95 (BP Location: Left Arm)   Pulse 94   Temp 98.6 F (37 C) (Oral)   Resp 18   SpO2 99%   Visual Acuity Right Eye Distance:   Left Eye Distance:   Bilateral Distance:    Right Eye Near:   Left Eye Near:    Bilateral Near:     Physical Exam Vitals and nursing note reviewed.  Constitutional:       General: She is not in acute distress.    Appearance: Normal appearance. She is well-developed. She is not ill-appearing.  HENT:     Head: Normocephalic and atraumatic.     Nose: Congestion present. No rhinorrhea.     Comments: Erythematous turbinates    Mouth/Throat:     Mouth: Mucous membranes are moist.     Pharynx: No oropharyngeal exudate or posterior oropharyngeal erythema.  Eyes:     General: No scleral icterus.    Conjunctiva/sclera: Conjunctivae normal.     Pupils: Pupils are equal, round, and reactive to light.  Cardiovascular:     Rate and Rhythm: Normal rate and regular rhythm.     Heart sounds: No murmur.  Pulmonary:     Effort: Pulmonary effort is normal. No respiratory distress.     Breath sounds: Normal breath sounds.  Abdominal:     Palpations: Abdomen is soft.     Tenderness: There is no abdominal tenderness.  Musculoskeletal:     Cervical back: Neck supple.     Right lower leg: No edema.     Left lower leg: No edema.  Skin:    General: Skin is warm and dry.  Neurological:     General: No focal deficit present.     Mental Status: She is alert and oriented to person, place, and time.  Psychiatric:        Mood and Affect: Mood normal.        Behavior: Behavior normal.        Thought Content: Thought content normal.        Judgment: Judgment normal.      UC Treatments / Results  Labs (all labs ordered are listed, but only abnormal results are displayed) Labs Reviewed  POC SARS CORONAVIRUS 2 AG -  ED  POC SARS CORONAVIRUS 2 AG    EKG   Radiology No results found.  Procedures Procedures (including critical care time)  Medications Ordered in UC Medications - No data to display  Initial Impression / Assessment and Plan / UC Course  I have reviewed the triage vital signs and the nursing notes.  Pertinent labs & imaging results that were available during my care of the patient were reviewed by me and considered in my medical decision making  (see chart for details).     #COVID - Rapid positive in clinic today. Mild symptoms. Home monitoring ordered. Referred to infusion clinic due to DM history. Symptomatic management with nasal  saline, tessalon for cough and mucinex. Discussed return and ED precautions.    Final Clinical Impressions(s) / UC Diagnoses   Final diagnoses:  Encounter for laboratory testing for COVID-19 virus  COVID-19     Discharge Instructions     Use the saline spray as much as you like throughout the day  Take the mucinex 2 times a day until symptoms begin to resolove  Take tylenol '325mg'$  tablet x 2 up to every 6 hours for sore throat, fever and body aches. Do not exceed 8 tablets in 24 hours  I will call the infusion center to refer you for possible treatments, they will reach out to you  You have tested positive for COVID-19 by way of our Rapid Antigen test. You will need to quarantine at this time.   The CDC Guidelines state you may stop quarantine once the below have been met. - It has been 10 days from the onset of symptoms  - You have had no fever for 24 consecutive hours without the use of fever reducing medications such as tylenol or advil - any  Covid related symptoms are improving.         ED Prescriptions    Medication Sig Dispense Auth. Provider   guaiFENesin (MUCINEX) 600 MG 12 hr tablet Take 1 tablet (600 mg total) by mouth 2 (two) times daily as needed. 12 tablet Sylvan Lahm, Marguerita Beards, PA-C   sodium chloride (OCEAN) 0.65 % SOLN nasal spray Place 2 sprays into both nostrils as needed for congestion. 60 mL Elmo Shumard, Marguerita Beards, PA-C   benzonatate (TESSALON) 100 MG capsule Take 1 capsule (100 mg total) by mouth every 8 (eight) hours. 21 capsule Carvel Huskins, Marguerita Beards, PA-C     PDMP not reviewed this encounter.   Purnell Shoemaker, PA-C 08/08/19 2011

## 2019-08-08 NOTE — Discharge Instructions (Addendum)
Use the saline spray as much as you like throughout the day  Take the mucinex 2 times a day until symptoms begin to resolove  Take tylenol '325mg'$  tablet x 2 up to every 6 hours for sore throat, fever and body aches. Do not exceed 8 tablets in 24 hours  I will call the infusion center to refer you for possible treatments, they will reach out to you  You have tested positive for COVID-19 by way of our Rapid Antigen test. You will need to quarantine at this time.   The CDC Guidelines state you may stop quarantine once the below have been met. - It has been 10 days from the onset of symptoms  - You have had no fever for 24 consecutive hours without the use of fever reducing medications such as tylenol or advil - any  Covid related symptoms are improving.

## 2019-08-08 NOTE — ED Triage Notes (Signed)
Pt presents to UC with nasal congestion x 4 days; loss of smell x 2 days.

## 2019-08-09 ENCOUNTER — Telehealth: Payer: Self-pay | Admitting: Nurse Practitioner

## 2019-08-09 ENCOUNTER — Encounter (INDEPENDENT_AMBULATORY_CARE_PROVIDER_SITE_OTHER): Payer: Self-pay

## 2019-08-09 NOTE — Telephone Encounter (Signed)
Called to Discuss with patient about Covid symptoms and the use of bamlanivimab, a monoclonal antibody infusion for those with mild to moderate Covid symptoms and at a high risk of hospitalization.     Pt is qualified for this infusion at the [facility] infusion center due to co-morbid conditions and/or a member of an at-risk group.     Unable to reach pt  

## 2019-08-10 ENCOUNTER — Encounter (INDEPENDENT_AMBULATORY_CARE_PROVIDER_SITE_OTHER): Payer: Self-pay

## 2019-08-11 ENCOUNTER — Encounter (INDEPENDENT_AMBULATORY_CARE_PROVIDER_SITE_OTHER): Payer: Self-pay

## 2019-08-12 ENCOUNTER — Encounter (INDEPENDENT_AMBULATORY_CARE_PROVIDER_SITE_OTHER): Payer: Self-pay

## 2019-08-13 ENCOUNTER — Encounter (INDEPENDENT_AMBULATORY_CARE_PROVIDER_SITE_OTHER): Payer: Self-pay

## 2019-08-14 ENCOUNTER — Encounter (INDEPENDENT_AMBULATORY_CARE_PROVIDER_SITE_OTHER): Payer: Self-pay

## 2019-08-15 ENCOUNTER — Encounter (INDEPENDENT_AMBULATORY_CARE_PROVIDER_SITE_OTHER): Payer: Self-pay

## 2019-08-16 ENCOUNTER — Encounter (INDEPENDENT_AMBULATORY_CARE_PROVIDER_SITE_OTHER): Payer: Self-pay

## 2019-08-18 ENCOUNTER — Encounter (INDEPENDENT_AMBULATORY_CARE_PROVIDER_SITE_OTHER): Payer: Self-pay

## 2019-08-19 ENCOUNTER — Encounter (INDEPENDENT_AMBULATORY_CARE_PROVIDER_SITE_OTHER): Payer: Self-pay

## 2019-08-20 ENCOUNTER — Encounter (INDEPENDENT_AMBULATORY_CARE_PROVIDER_SITE_OTHER): Payer: Self-pay

## 2019-08-25 ENCOUNTER — Other Ambulatory Visit: Payer: Self-pay

## 2019-08-25 ENCOUNTER — Emergency Department (HOSPITAL_BASED_OUTPATIENT_CLINIC_OR_DEPARTMENT_OTHER): Payer: Self-pay

## 2019-08-25 ENCOUNTER — Emergency Department (HOSPITAL_BASED_OUTPATIENT_CLINIC_OR_DEPARTMENT_OTHER)
Admission: EM | Admit: 2019-08-25 | Discharge: 2019-08-25 | Disposition: A | Discharge status: Home / Self Care | Payer: Self-pay | Attending: Emergency Medicine | Admitting: Emergency Medicine

## 2019-08-25 ENCOUNTER — Encounter (HOSPITAL_BASED_OUTPATIENT_CLINIC_OR_DEPARTMENT_OTHER): Payer: Self-pay

## 2019-08-25 DIAGNOSIS — M25561 Pain in right knee: Secondary | ICD-10-CM | POA: Insufficient documentation

## 2019-08-25 DIAGNOSIS — E119 Type 2 diabetes mellitus without complications: Secondary | ICD-10-CM | POA: Insufficient documentation

## 2019-08-25 DIAGNOSIS — I4891 Unspecified atrial fibrillation: Secondary | ICD-10-CM | POA: Insufficient documentation

## 2019-08-25 DIAGNOSIS — Z79899 Other long term (current) drug therapy: Secondary | ICD-10-CM | POA: Insufficient documentation

## 2019-08-25 DIAGNOSIS — F17210 Nicotine dependence, cigarettes, uncomplicated: Secondary | ICD-10-CM | POA: Insufficient documentation

## 2019-08-25 DIAGNOSIS — Z8673 Personal history of transient ischemic attack (TIA), and cerebral infarction without residual deficits: Secondary | ICD-10-CM | POA: Insufficient documentation

## 2019-08-25 DIAGNOSIS — M2391 Unspecified internal derangement of right knee: Secondary | ICD-10-CM

## 2019-08-25 MED ORDER — ETODOLAC 300 MG PO CAPS: 300.0000 mg | ORAL_CAPSULE | Freq: Three times a day (TID) | ORAL | 0 refills | Status: DC

## 2019-08-25 MED ORDER — IBUPROFEN 400 MG PO TABS
600.0000 mg | ORAL_TABLET | Freq: Once | ORAL | Status: AC
  Administered 2019-08-25: 600 mg via ORAL
  Filled 2019-08-25: qty 1

## 2019-08-25 MED ORDER — OXYCODONE-ACETAMINOPHEN 5-325 MG PO TABS
1.0000 | ORAL_TABLET | Freq: Once | ORAL | Status: AC
  Administered 2019-08-25: 14:00:00 1 via ORAL
  Filled 2019-08-25: qty 1

## 2019-08-25 NOTE — ED Notes (Signed)
Patient transported to X-ray 

## 2019-08-25 NOTE — ED Provider Notes (Signed)
[facility] EMERGENCY DEPARTMENT Provider Note   CSN: [id number] Arrival date & time: 08/25/19  1204     History Chief Complaint  Patient presents with  . Knee Pain    Tina [name] is a 49 y.o. female.  HPI   Patient states she previously injured her right knee.  She has been told in the past that she injured the cartilage in her knee and surgery was recommended.  Patient opted to try not operative management.  She has been working from home recently and has not had to walk as much.  Today however she was sitting crosslegged.  She started noticed some aching in her leg and so she straighten out her knee.  It improved however she ended up crossing her legs again and shortly thereafter she felt like her knee was locked and she could not straighten it out.  The pain does radiate up towards her leg.  Patient denies any recent falls.  No fevers or chills.  Past Medical History:  Diagnosis Date  . Atrial fibrillation ([facility])   . Diabetes mellitus without complication ([facility])   . TIA (transient ischemic attack) 2017    There are no problems to display for this patient.   Past Surgical History:  Procedure Laterality Date  . CARDIAC SURGERY  10/2016   repair PFO     OB History   No obstetric history on file.     No family history on file.  Social History   Tobacco Use  . Smoking status: Current Every Day Smoker    Packs/day: 0.25    Years: 6.00    Pack years: 1.50    Types: Cigarettes  . Smokeless tobacco: Never Used  Substance Use Topics  . Alcohol use: Not Currently  . Drug use: Never    Home Medications Prior to Admission medications   Medication Sig Start Date End Date Taking? Authorizing Provider  benzonatate (TESSALON) 100 MG capsule Take 1 capsule (100 mg total) by mouth every 8 (eight) hours. 08/08/19   Darr, Veryl Speak, PA-C  etodolac (LODINE) 300 MG capsule Take 1 capsule (300 mg total) by mouth every 8 (eight) hours. 08/25/19   Linwood Dibbles, MD    fluticasone (FLONASE) 50 MCG/ACT nasal spray Place 1 spray into both nostrils daily. 05/15/19   Mickie Bail, NP  guaiFENesin (MUCINEX) 600 MG 12 hr tablet Take 1 tablet (600 mg total) by mouth 2 (two) times daily as needed. 08/08/19   Darr, Veryl Speak, PA-C  HYDROcodone-acetaminophen (NORCO/VICODIN) 5-325 MG tablet Take 1 tablet by mouth every 4 (four) hours as needed. 09/20/18   Jacalyn Lefevre, MD  ibuprofen (ADVIL,MOTRIN) 600 MG tablet Take 1 tablet (600 mg total) by mouth every 6 (six) hours as needed. 09/20/18   Jacalyn Lefevre, MD  Multiple Vitamins-Minerals (MULTIVITAMIN ADULTS PO) Take by mouth.    [provider]  sodium chloride (OCEAN) 0.65 % SOLN nasal spray Place 2 sprays into both nostrils as needed for congestion. 08/08/19   Darr, Veryl Speak, PA-C    Allergies    Patient has no known allergies.  Review of Systems   Review of Systems  All other systems reviewed and are negative.   Physical Exam Updated Vital Signs BP (!) 157/106 (BP Location: Left Arm)   Pulse 90   Temp 99 F (37.2 C) (Oral)   Resp 18   Ht 1.702 m (5\' 7" )   Wt 61.2 kg   SpO2 100%   BMI 21.14 kg/m  Physical Exam Vitals and nursing note reviewed.  Constitutional:      General: She is not in acute distress.    Appearance: She is well-developed.  HENT:     Head: Normocephalic and atraumatic.     Right Ear: External ear normal.     Left Ear: External ear normal.  Eyes:     General: No scleral icterus.       Right eye: No discharge.        Left eye: No discharge.     Conjunctiva/sclera: Conjunctivae normal.  Neck:     Trachea: No tracheal deviation.  Cardiovascular:     Rate and Rhythm: Normal rate.  Pulmonary:     Effort: Pulmonary effort is normal. No respiratory distress.     Breath sounds: No stridor.  Abdominal:     General: There is no distension.  Musculoskeletal:        General: No swelling or deformity.     Cervical back: Neck supple.     Right knee: No crepitus. Decreased  range of motion. Tenderness present.     Comments: Right knee is held in flexed position at approximately 90 degrees, patient is unable to straighten her leg  Skin:    General: Skin is warm and dry.     Findings: No rash.  Neurological:     Mental Status: She is alert.     Cranial Nerves: Cranial nerve deficit: no gross deficits.     ED Results / Procedures / Treatments   Labs (all labs ordered are listed, but only abnormal results are displayed) Labs Reviewed - No data to display  EKG None  Radiology DG Knee Complete 4 Views Right  Result Date: 08/25/2019 CLINICAL DATA:  Knee pain.  The knee is locked in a flexed position. EXAM: RIGHT KNEE - COMPLETE 4+ VIEW COMPARISON:  Radiographs dated 09/20/2018 FINDINGS: No evidence of fracture, dislocation, or joint effusion. No evidence of arthropathy or other focal bone abnormality. Soft tissues are unremarkable. IMPRESSION: No appreciable abnormality. The patient had the same symptoms on 09/20/2018 as well as on 03/29/2008 and 07/09/2009. Electronically Signed   By: Francene Boyers M.D.   On: 08/25/2019 12:54    Procedures .Ortho Injury Treatment  Date/Time: 08/25/2019 1:39 PM Performed by: Linwood Dibbles, MD Authorized by: Linwood Dibbles, MD   Consent:    Consent obtained:  Verbal   Consent given by:  Patient   Procedural risks discussed: increasing pain.   Alternatives discussed:  No treatmentPre-procedure neurovascular assessment: neurovascularly intact Pre-procedure distal perfusion: normal Pre-procedure neurological function: normal Pre-procedure range of motion: normal  Anesthesia: Local anesthesia used: no  Patient sedated: NoComments: Traction and knee manipulation for locked knee    (including critical care time)  Medications Ordered in ED Medications  ibuprofen (ADVIL) tablet 600 mg (600 mg Oral Given 08/25/19 1231)  oxyCODONE-acetaminophen (PERCOCET/ROXICET) 5-325 MG per tablet 1 tablet (1 tablet Oral Given 08/25/19 1345)      ED Course  I have reviewed the triage vital signs and the nursing notes.  Pertinent labs & imaging results that were available during my care of the patient were reviewed by me and considered in my medical decision making (see chart for details).  Clinical Course as of Aug 25 1407  Fri Aug 25, 2019  1406 Pt feeling better now.  Able to extend her leg more.  Will dc in knee immobilizer   [JK]    Clinical Course User Index [JK] Linwood Dibbles, MD  MDM Rules/Calculators/A&P                      Patient most likely has a meniscal injury resulting in her locked left knee.  Pt has had this happen to her in the past.  With manipulation she is able to more easily extend her knee now.  Will discharge home in knee immobilizer and provided with outpatient orthopedic follow-up.  Final Clinical Impression(s) / ED Diagnoses Final diagnoses:  Locked knee, right    Rx / DC Orders ED Discharge Orders         Ordered    etodolac (LODINE) 300 MG capsule  Every 8 hours    Note to Pharmacy: As needed for pain   08/25/19 1407           Dorie Rank, MD 08/25/19 1409

## 2019-08-25 NOTE — Discharge Instructions (Signed)
Take the medications as needed for pain, follow up with an orthopedic doctor for further treatment

## 2019-08-25 NOTE — ED Triage Notes (Addendum)
Pt c/o right knee "is stuck"-states started 45 min PTA-denies recent injury-to triage on crutches

## 2019-08-25 NOTE — ED Notes (Signed)
Called to lobby to speak to patients family.  Family members angry, Demanding to know why the patient has not gotten any pain medication and why we aren't doing anything.   Family raising their voice, states threatening to take the patient to another facility.  Tried to explain that she did get pain medication and was getting another dose but was interrupted by both family members.  Re-explained to family that she had a dose of pain medication earlier and was about to get another.  Family arguing that ibuprofen is not pain medication.  One family member on phone with patient.  Patient cursing, states the doctor f*in moved my leg and it hurt.  Who f*in does this.   They should have given me f*in pain medication before the f*in doctor tried to f*in move my leg.  Other family member threatening that the staff needs to speed things up.  I explained that we were trying to give the fastest, safest care possible.  Family cursing, pointing her finger in my face,  states that is not good enough, you need to pick it up.

## 2019-09-05 ENCOUNTER — Other Ambulatory Visit: Payer: Self-pay

## 2019-09-05 ENCOUNTER — Ambulatory Visit (INDEPENDENT_AMBULATORY_CARE_PROVIDER_SITE_OTHER): Payer: Self-pay | Admitting: Orthopaedic Surgery

## 2019-09-05 ENCOUNTER — Encounter: Payer: Self-pay | Admitting: Orthopaedic Surgery

## 2019-09-05 DIAGNOSIS — M2391 Unspecified internal derangement of right knee: Secondary | ICD-10-CM

## 2019-09-05 MED ORDER — HYDROCODONE-ACETAMINOPHEN 5-325 MG PO TABS: 1.0000 | ORAL_TABLET | Freq: Four times a day (QID) | ORAL | 0 refills | Status: DC | PRN

## 2019-09-05 NOTE — Progress Notes (Signed)
Office Visit Note   Patient: Tina [name]           Date of Birth: [birth year]           MRN: [id number] Visit Date: 09/05/2019              Requested by: No referring provider defined for this encounter. PCP: Patient, No Pcp Per   Assessment & Plan: Visit Diagnoses:  1. Locking of right knee     Plan: Patient is a knee immobilizer and crutches.  She can progress with weightbearing as tolerated as she is done in the past.  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  Once her insurance is active she can call about scheduling an MRI scan of her knee.  We discussed likely meniscal tear likely bucket-handle tear or large flap tear with repetitive locking episodes.  We discussed avoiding positions that have caused her knee to lock in the past.  Will await her phone call about scheduling the MRI when she is ready.  Follow-Up Instructions: Return in about 3 weeks (around 09/26/2019).   Orders:  No orders of the defined types were placed in this encounter.  Meds ordered this encounter  Medications  . HYDROcodone-acetaminophen (NORCO/VICODIN) 5-325 MG tablet    Sig: Take 1-2 tablets by mouth every 6 (six) hours as needed for moderate pain.    Dispense:  15 tablet    Refill:  0      Procedures: No procedures performed   Clinical Data: No additional findings.   Subjective: Chief Complaint  Patient presents with  . Right Knee - Pain    HPI 49 year old female seen with a locked knee in the required sedation for manipulation done in the emergency room on 08/25/2019.  She has had previous episodes of this 09/20/2018, 03/29/2008 and 07/09/2009.  Episodes occur when the knee is in flexed position most recently she was sitting on her foot when she tried to extend her knee it was locked.  She has tried anti-inflammatory she states the knee is painful she is not had any swelling since the knee was locked and has been in a knee immobilizer on crutches.  Patient started a new job and states she is  waiting for insurance to get active.  In the past surgeries been discussed with her.  She does not recall ever having an MRI of her knee.  She states she was told that she probably has a torn meniscus.  Other than the acute locking episode she states most the time her knee does not bother her.  Review of [facility] history of repair of PFO 10/2016.  Positive for smoking.  Positive for repetitive right knee locking otherwise negative as pertains HPI.   Objective: Vital Signs: BP (!) 158/102   Pulse 100   Ht 5\' 7"  (1.702 m)   Wt 135 lb (61.2 kg)   BMI 21.14 kg/m   Physical Exam Constitutional:      Appearance: She is well-developed.  HENT:     Head: Normocephalic.     Right Ear: External ear normal.     Left Ear: External ear normal.  Eyes:     Pupils: Pupils are equal, round, and reactive to light.  Neck:     Thyroid: No thyromegaly.     Trachea: No tracheal deviation.  Cardiovascular:     Rate and Rhythm: Normal rate.  Pulmonary:     Effort: Pulmonary effort is normal.  Abdominal:     Palpations:  Abdomen is soft.  Skin:    General: Skin is warm and dry.  Neurological:     Mental Status: She is alert and oriented to person, place, and time.  Psychiatric:        Behavior: Behavior normal.     Ortho Exam patient has no knee effusion.  She complains of pain with palpation all around her knee medial and lateral reaches and tries to grab my hand.  It may be that she has slightly more tenderness lateral than medial tib-fib joint is normal pes bursa is normal. Specialty Comments:  No specialty comments available.  Imaging: No results found.   PMFS History: Patient Active Problem List   Diagnosis Date Noted  . Locking of right knee 09/06/2019   Past Medical History:  Diagnosis Date  . Atrial fibrillation ([facility])   . Diabetes mellitus without complication ([facility])   . TIA (transient ischemic attack) 2017    No family history on file.  Past Surgical History:  Procedure  Laterality Date  . CARDIAC SURGERY  10/2016   repair PFO   Social History   Occupational History  . Not on file  Tobacco Use  . Smoking status: Current Every Day Smoker    Packs/day: 0.25    Years: 6.00    Pack years: 1.50    Types: Cigarettes  . Smokeless tobacco: Never Used  Substance and Sexual Activity  . Alcohol use: Not Currently  . Drug use: Never  . Sexual activity: Not on file

## 2019-09-06 DIAGNOSIS — M2391 Unspecified internal derangement of right knee: Secondary | ICD-10-CM | POA: Insufficient documentation

## 2019-09-13 ENCOUNTER — Other Ambulatory Visit: Payer: Self-pay

## 2019-09-13 ENCOUNTER — Encounter: Payer: Self-pay | Admitting: Orthopaedic Surgery

## 2019-09-13 ENCOUNTER — Ambulatory Visit: Payer: Self-pay

## 2019-09-13 ENCOUNTER — Ambulatory Visit (INDEPENDENT_AMBULATORY_CARE_PROVIDER_SITE_OTHER): Payer: Self-pay | Admitting: Family Medicine

## 2019-09-13 ENCOUNTER — Ambulatory Visit (INDEPENDENT_AMBULATORY_CARE_PROVIDER_SITE_OTHER): Payer: Self-pay | Admitting: Orthopaedic Surgery

## 2019-09-13 ENCOUNTER — Encounter: Payer: Self-pay | Admitting: Family Medicine

## 2019-09-13 VITALS — Ht 67.0 in | Wt 135.0 lb

## 2019-09-13 VITALS — BP 151/99 | Ht 67.0 in | Wt 135.0 lb

## 2019-09-13 DIAGNOSIS — M2391 Unspecified internal derangement of right knee: Secondary | ICD-10-CM

## 2019-09-13 NOTE — Progress Notes (Signed)
Medication Samples have been provided to the patient.  Drug name: Duexis       Strength: 800mg /26.6mg         Qty: 2 Boxes  LOT  Exp.Date: 08/2020  Dosing instructions: Take 1 tablet by mouth three (3) times a day.  The patient has been instructed regarding the correct time, dose, and frequency of taking this medication, including desired effects and most common side effects.   09/2020, MA 2:40 PM 09/13/2019

## 2019-09-13 NOTE — Assessment & Plan Note (Signed)
She reports a mechanical symptom but no inciting event or injury.  No effusion on exam today.  Does have some degenerative changes. -Hinged knee brace. -Provided samples of Duexis and Pennsaid. -Counseled on home exercise therapy and supportive care. -If no improvement can consider injection.  Would recommend MRI with mechanical symptoms as well.

## 2019-09-13 NOTE — Patient Instructions (Signed)
Nice to meet you Please try ice  Please try the brace  Please try the rolling walker  Please try the exercises  Please use the pennsaid for mild pain and the duexis for moderate pain   Please send me a message in MyChart with any questions or updates.  Please see me back in 4 weeks.   --Dr. Jordan Likes

## 2019-09-13 NOTE — Progress Notes (Signed)
Tina [name] - 49 y.o. female MRN [id number]  Date of birth: 15-Jun-1971  SUBJECTIVE:  Including CC & ROS.  Chief Complaint  Patient presents with  . Knee Pain    right knee    Tina [name] is a 49 y.o. female that is presenting with acute on chronic right knee pain.  The pain is been ongoing several years.  Over the past 3 weeks the pain is significantly worse.  The pain will lock in certain positions or she cannot flex the knee.  She is having to walk with crutches.  She denies any specific inciting event.  No prior surgery.  Pain is constant severe.  Independent review of the right knee x-ray from 2/12 shows acute abnormality.   Review of Systems See HPI   HISTORY: Past Medical, Surgical, Social, and Family History Reviewed & Updated per EMR.   Pertinent Historical Findings include:  Past Medical History:  Diagnosis Date  . Atrial fibrillation ([place])   . Diabetes mellitus without complication ([place])   . TIA (transient ischemic attack) 2017    Past Surgical History:  Procedure Laterality Date  . CARDIAC SURGERY  10/2016   repair PFO    No family history on file.  Social History   Socioeconomic History  . Marital status: Single    Spouse name: Not on file  . Number of children: Not on file  . Years of education: Not on file  . Highest education level: Not on file  Occupational History  . Not on file  Tobacco Use  . Smoking status: Current Every Day Smoker    Packs/day: 0.25    Years: 6.00    Pack years: 1.50    Types: Cigarettes  . Smokeless tobacco: Never Used  Substance and Sexual Activity  . Alcohol use: Not Currently  . Drug use: Never  . Sexual activity: Not on file  Other Topics Concern  . Not on file  Social History Narrative  . Not on file   Social Determinants of Health   Financial Resource Strain:   . Difficulty of Paying Living Expenses: Not on file  Food Insecurity:   . Worried About Charity fundraiser in the Last Year: Not on file    . Ran Out of Food in the Last Year: Not on file  Transportation Needs:   . Lack of Transportation (Medical): Not on file  . Lack of Transportation (Non-Medical): Not on file  Physical Activity:   . Days of Exercise per Week: Not on file  . Minutes of Exercise per Session: Not on file  Stress:   . Feeling of Stress : Not on file  Social Connections:   . Frequency of Communication with Friends and Family: Not on file  . Frequency of Social Gatherings with Friends and Family: Not on file  . Attends Religious Services: Not on file  . Active Member of Clubs or Organizations: Not on file  . Attends Archivist Meetings: Not on file  . Marital Status: Not on file  Intimate Partner Violence:   . Fear of Current or Ex-Partner: Not on file  . Emotionally Abused: Not on file  . Physically Abused: Not on file  . Sexually Abused: Not on file     PHYSICAL EXAM:  VS: BP (!) 151/99   Ht 5\' 7"  (1.702 m)   Wt 135 lb (61.2 kg)   BMI 21.14 kg/m  Physical Exam Gen: NAD, alert, cooperative with exam, well-appearing MSK:  Right knee:  No effusion. Limited flexion. Tenderness palpation over the patellar tendon. No instability. Neurovascularly intact   Limited ultrasound: Right knee:  No effusion. Normal-appearing quadricep and patellar tendon. Mild medial joint space narrowing.  Degenerative changes appreciated within the meniscus with hypoechoic change.  No significant outpouching of the meniscus. Lateral joint line with mild narrowing.  Summary: Degenerative changes appreciated of the meniscus and joint line.  Ultrasound and interpretation by Clare Gandy, MD   ASSESSMENT & PLAN:   Locking of right knee She reports a mechanical symptom but no inciting event or injury.  No effusion on exam today.  Does have some degenerative changes. -Hinged knee brace. -Provided samples of Duexis and Pennsaid. -Counseled on home exercise therapy and supportive care. -If no  improvement can consider injection.  Would recommend MRI with mechanical symptoms as well.

## 2019-09-13 NOTE — Progress Notes (Signed)
49 year old female returns she has been using her knee immobilizer also crutches she took knee immobilizer off took shower when she got a shower set down the head of the bed she states her knee locked again she was able to manipulate it get back extended on her own and put the knee immobilizer back on.  This is her fifth episode of locking over a number of years.  She is waiting on insurance approval and then will call us about scheduling MRI to evaluate her for the torn meniscus with repetitive locking of the knee.  In the meantime should keep the knee immobilizer on when she removes it for bathing we discussed when she sits down keep her knee extended to prevent locking she can still do some straight leg raising to prevent quadriceps atrophy.  No change in patient's family social history review of systems medications.  No visits since she last saw me.  She will call when she gets insurance approval so we can schedule her MRI.

## 2019-09-13 NOTE — Progress Notes (Signed)
Medication Samples have been provided to the patient.  Drug name: Pennsaid       Strength: 2%        Qty: 2 Boxes  LOT: [id number]  Exp.Date: 05/2020  Dosing instructions: Use a pea size amount and rub gently.  The patient has been instructed regarding the correct time, dose, and frequency of taking this medication, including desired effects and most common side effects.   Kathi Simpers, MA 2:35 PM 09/13/2019

## 2019-09-26 ENCOUNTER — Ambulatory Visit: Payer: Self-pay | Admitting: Orthopaedic Surgery

## 2019-10-09 ENCOUNTER — Emergency Department (HOSPITAL_COMMUNITY): Payer: Self-pay

## 2019-10-09 ENCOUNTER — Emergency Department (HOSPITAL_COMMUNITY)
Admission: EM | Admit: 2019-10-09 | Discharge: 2019-10-09 | Disposition: A | Discharge status: Home / Self Care | Payer: Self-pay | Attending: Emergency Medicine | Admitting: Emergency Medicine

## 2019-10-09 ENCOUNTER — Encounter (HOSPITAL_COMMUNITY): Payer: Self-pay | Admitting: Emergency Medicine

## 2019-10-09 ENCOUNTER — Other Ambulatory Visit: Payer: Self-pay

## 2019-10-09 DIAGNOSIS — R202 Paresthesia of skin: Secondary | ICD-10-CM | POA: Insufficient documentation

## 2019-10-09 DIAGNOSIS — R079 Chest pain, unspecified: Secondary | ICD-10-CM

## 2019-10-09 DIAGNOSIS — Z7901 Long term (current) use of anticoagulants: Secondary | ICD-10-CM | POA: Insufficient documentation

## 2019-10-09 DIAGNOSIS — Z79899 Other long term (current) drug therapy: Secondary | ICD-10-CM | POA: Insufficient documentation

## 2019-10-09 DIAGNOSIS — F17210 Nicotine dependence, cigarettes, uncomplicated: Secondary | ICD-10-CM | POA: Insufficient documentation

## 2019-10-09 DIAGNOSIS — R0789 Other chest pain: Secondary | ICD-10-CM | POA: Insufficient documentation

## 2019-10-09 DIAGNOSIS — R200 Anesthesia of skin: Secondary | ICD-10-CM | POA: Insufficient documentation

## 2019-10-09 DIAGNOSIS — E1165 Type 2 diabetes mellitus with hyperglycemia: Secondary | ICD-10-CM | POA: Insufficient documentation

## 2019-10-09 LAB — BASIC METABOLIC PANEL
Anion gap: 11 (ref 5–15)
BUN: <5 mg/dL — ABNORMAL LOW (ref 6–20)
CO2: 25 mmol/L (ref 22–32)
Calcium: 9.2 mg/dL (ref 8.9–10.3)
Chloride: 102 mmol/L (ref 98–111)
Creatinine, Ser: 0.48 mg/dL (ref 0.44–1.00)
GFR calc Af Amer: >60 mL/min (ref >60)
GFR calc non Af Amer: >60 mL/min (ref >60)
Glucose, Bld: 260 mg/dL — ABNORMAL HIGH (ref 70–99)
Potassium: 3.2 mmol/L — ABNORMAL LOW (ref 3.5–5.1)
Sodium: 138 mmol/L (ref 135–145)

## 2019-10-09 LAB — I-STAT BETA HCG BLOOD, ED (MC, WL, AP ONLY): I-stat hCG, quantitative: <5 m[IU]/mL (ref <5)

## 2019-10-09 LAB — CBC
HCT: 42.4 % (ref 36.0–46.0)
Hemoglobin: 14.4 g/dL (ref 12.0–15.0)
MCH: 30.1 pg (ref 26.0–34.0)
MCHC: 34 g/dL (ref 30.0–36.0)
MCV: 88.5 fL (ref 80.0–100.0)
Platelets: 193 10*3/uL (ref 150–400)
RBC: 4.79 MIL/uL (ref 3.87–5.11)
RDW: 12.7 % (ref 11.5–15.5)
WBC: 7.9 10*3/uL (ref 4.0–10.5)
nRBC: 0 % (ref 0.0–0.2)

## 2019-10-09 LAB — URINALYSIS, ROUTINE W REFLEX MICROSCOPIC
Bilirubin Urine: NEGATIVE
Glucose, UA: >=500 mg/dL — AB
Hgb urine dipstick: NEGATIVE
Ketones, ur: NEGATIVE mg/dL
Nitrite: NEGATIVE
Protein, ur: 30 mg/dL — AB
Specific Gravity, Urine: 1.025 (ref 1.005–1.030)
Squamous Epithelial / HPF: >50 — ABNORMAL HIGH (ref 0–5)
pH: 5 (ref 5.0–8.0)

## 2019-10-09 LAB — DIFFERENTIAL
Abs Immature Granulocytes: 0.02 10*3/uL (ref 0.00–0.07)
Basophils Absolute: 0.1 10*3/uL (ref 0.0–0.1)
Basophils Relative: 1 %
Eosinophils Absolute: 0.2 10*3/uL (ref 0.0–0.5)
Eosinophils Relative: 3 %
Immature Granulocytes: 0 %
Lymphocytes Relative: 45 %
Lymphs Abs: 3.6 10*3/uL (ref 0.7–4.0)
Monocytes Absolute: 0.5 10*3/uL (ref 0.1–1.0)
Monocytes Relative: 7 %
Neutro Abs: 3.5 10*3/uL (ref 1.7–7.7)
Neutrophils Relative %: 44 %

## 2019-10-09 LAB — RAPID URINE DRUG SCREEN, HOSP PERFORMED
Amphetamines: NOT DETECTED
Barbiturates: NOT DETECTED
Benzodiazepines: NOT DETECTED
Cocaine: NOT DETECTED
Opiates: NOT DETECTED
Tetrahydrocannabinol: NOT DETECTED

## 2019-10-09 LAB — APTT: aPTT: 27 seconds (ref 24–36)

## 2019-10-09 LAB — TROPONIN I (HIGH SENSITIVITY)
Troponin I (High Sensitivity): 5 ng/L (ref <18)
Troponin I (High Sensitivity): 5 ng/L (ref <18)

## 2019-10-09 LAB — CBG MONITORING, ED: Glucose-Capillary: 309 mg/dL — ABNORMAL HIGH (ref 70–99)

## 2019-10-09 LAB — PROTIME-INR
INR: 1 (ref 0.8–1.2)
Prothrombin Time: 13.4 seconds (ref 11.4–15.2)

## 2019-10-09 LAB — ETHANOL: Alcohol, Ethyl (B): <10 mg/dL (ref <10)

## 2019-10-09 MED ORDER — KETOROLAC TROMETHAMINE 30 MG/ML IJ SOLN
30.0000 mg | Freq: Once | INTRAMUSCULAR | Status: AC
  Administered 2019-10-09: 30 mg via INTRAVENOUS
  Filled 2019-10-09: qty 1

## 2019-10-09 MED ORDER — POTASSIUM CHLORIDE CRYS ER 20 MEQ PO TBCR
20.0000 meq | EXTENDED_RELEASE_TABLET | Freq: Once | ORAL | Status: AC
  Administered 2019-10-09: 20 meq via ORAL
  Filled 2019-10-09: qty 1

## 2019-10-09 MED ORDER — SODIUM CHLORIDE 0.9 % IV SOLN
100.0000 mL/h | INTRAVENOUS | Status: DC
  Administered 2019-10-09: 09:00:00 100 mL/h via INTRAVENOUS

## 2019-10-09 MED ORDER — SODIUM CHLORIDE 0.9 % IV BOLUS
500.0000 mL | Freq: Once | INTRAVENOUS | Status: AC
  Administered 2019-10-09: 09:00:00 500 mL via INTRAVENOUS

## 2019-10-09 MED ORDER — SODIUM CHLORIDE 0.9% FLUSH
3.0000 mL | Freq: Once | INTRAVENOUS | Status: AC
  Administered 2019-10-09: 08:00:00 3 mL via INTRAVENOUS

## 2019-10-09 NOTE — ED Notes (Signed)
Patient transported to X-ray 

## 2019-10-09 NOTE — ED Notes (Signed)
Pt discharge instructions and follow-up care reviewed with the patient. The patient verbalized understanding of both. Pt discharged. 

## 2019-10-09 NOTE — ED Provider Notes (Signed)
[facility] EMERGENCY DEPARTMENT Provider Note   CSN: [id number] Arrival date & time: 10/09/19  0744     History Chief Complaint  Patient presents with  . Chest Pain  . Numbness    Tina [name] is a 49 y.o. female.  HPI   HPI: A 49 year old patient with a history of CVA presents for evaluation of chest pain. Initial onset of pain was more than 6 hours ago. The patient's chest pain is not worse with exertion. The patient's chest pain is not middle- or left-sided, is not well-localized, is not described as heaviness/pressure/tightness, is not sharp and does not radiate to the arms/jaw/neck. The patient does not complain of nausea and denies diaphoresis. The patient has smoked in the past 90 days. The patient has no history of peripheral artery disease, denies any history of treated diabetes, has no relevant family history of coronary artery disease (first degree relative at less than age 33), is not hypertensive, has no history of hypercholesterolemia and does not have an elevated BMI (>=30).    Patient presents ED with chief complaints.  Patient states she has been having chest pain for the last several days.  The pain is a dull discomfort in her right side of her chest.  Does not radiate.  She is not having any nausea diaphoresis or shortness of breath.  Patient however woke up this morning with numbness in her legs bilaterally and she also experienced some tingling in her left fingertips.  Patient states she had a stroke in the past that was treated with TPA.  According to the record she does have a history of atrial fibrillation and a PFO.  Patient is currently on anticoagulation.  She is not having any trouble with weakness.  She denies any trouble with speech.  She thought her vision was a little blurry earlier this morning but it is fine now.  Past Medical History:  Diagnosis Date  . Atrial fibrillation ([facility])   . Diabetes mellitus without complication ([facility])   .  TIA (transient ischemic attack) 2017    Patient Active Problem List   Diagnosis Date Noted  . Locking of right knee 09/06/2019    Past Surgical History:  Procedure Laterality Date  . CARDIAC SURGERY  10/2016   repair PFO     OB History   No obstetric history on file.     No family history on file.  Social History   Tobacco Use  . Smoking status: Current Every Day Smoker    Packs/day: 0.25    Years: 6.00    Pack years: 1.50    Types: Cigarettes  . Smokeless tobacco: Never Used  Substance Use Topics  . Alcohol use: Not Currently  . Drug use: Never    Home Medications Prior to Admission medications   Medication Sig Start Date End Date Taking? Authorizing Provider  etodolac (LODINE) 300 MG capsule Take 1 capsule (300 mg total) by mouth every 8 (eight) hours. Patient taking differently: Take 300 mg by mouth every 8 (eight) hours as needed for mild pain.  08/25/19  Yes Linwood Dibbles, MD  Multiple Vitamins-Minerals (MULTIVITAMIN ADULTS PO) Take 1 tablet by mouth daily.    Yes [provider]  benzonatate (TESSALON) 100 MG capsule Take 1 capsule (100 mg total) by mouth every 8 (eight) hours. Patient not taking: Reported on 10/09/2019 08/08/19   Darr, Veryl Speak, PA-C  fluticasone [facility]) 50 MCG/ACT nasal spray Place 1 spray into both nostrils daily. Patient not  taking: Reported on 10/09/2019 05/15/19   Mickie Bail, NP  guaiFENesin (MUCINEX) 600 MG 12 hr tablet Take 1 tablet (600 mg total) by mouth 2 (two) times daily as needed. Patient not taking: Reported on 10/09/2019 08/08/19   Darr, Veryl Speak, PA-C  HYDROcodone-acetaminophen (NORCO/VICODIN) 5-325 MG tablet Take 1 tablet by mouth every 4 (four) hours as needed. Patient not taking: Reported on 09/05/2019 09/20/18   Jacalyn Lefevre, MD  HYDROcodone-acetaminophen (NORCO/VICODIN) 5-325 MG tablet Take 1-2 tablets by mouth every 6 (six) hours as needed for moderate pain. Patient not taking: Reported on 10/09/2019 09/05/19   Eldred Manges, MD  ibuprofen (ADVIL,MOTRIN) 600 MG tablet Take 1 tablet (600 mg total) by mouth every 6 (six) hours as needed. Patient not taking: Reported on 09/05/2019 09/20/18   Jacalyn Lefevre, MD  sodium chloride (OCEAN) 0.65 % SOLN nasal spray Place 2 sprays into both nostrils as needed for congestion. Patient not taking: Reported on 10/09/2019 08/08/19   Darr, Veryl Speak, PA-C    Allergies    Bee venom and Mushroom extract complex  Review of Systems   Review of Systems  All other systems reviewed and are negative.   Physical Exam Updated Vital Signs BP (!) 154/96   Pulse 75   Temp 98.2 F (36.8 C) (Oral)   Resp 18   Ht 1.702 m (5\' 7" )   Wt 63.5 kg   SpO2 100%   BMI 21.93 kg/m   Physical Exam Vitals and nursing note reviewed.  Constitutional:      General: She is not in acute distress.    Appearance: She is well-developed.  HENT:     Head: Normocephalic and atraumatic.     Right Ear: External ear normal.     Left Ear: External ear normal.  Eyes:     General: No scleral icterus.       Right eye: No discharge.        Left eye: No discharge.     Conjunctiva/sclera: Conjunctivae normal.  Neck:     Trachea: No tracheal deviation.  Cardiovascular:     Rate and Rhythm: Normal rate and regular rhythm.  Pulmonary:     Effort: Pulmonary effort is normal. No respiratory distress.     Breath sounds: Normal breath sounds. No stridor. No wheezing or rales.  Abdominal:     General: Bowel sounds are normal. There is no distension.     Palpations: Abdomen is soft.     Tenderness: There is no abdominal tenderness. There is no guarding or rebound.  Musculoskeletal:        General: No tenderness.     Cervical back: Neck supple.  Skin:    General: Skin is warm and dry.     Findings: No rash.  Neurological:     Mental Status: She is alert and oriented to person, place, and time.     Cranial Nerves: No cranial nerve deficit (No facial droop, extraocular movements intact, tongue midline  ).     Sensory: No sensory deficit.     Motor: No abnormal muscle tone or seizure activity.     Coordination: Coordination normal.     Comments: No pronator drift bilateral upper extrem, able to hold both legs off bed for 5 seconds, sensation intact in all extremities, no visual field cuts, no left or right sided neglect, normal finger-nose exam bilaterally, no nystagmus noted      ED Results / Procedures / Treatments   Labs (all labs  ordered are listed, but only abnormal results are displayed) Labs Reviewed  BASIC METABOLIC PANEL - Abnormal; Notable for the following components:      Result Value   Potassium 3.2 (*)    Glucose, Bld 260 (*)    BUN <5 (*)    All other components within normal limits  URINALYSIS, ROUTINE W REFLEX MICROSCOPIC - Abnormal; Notable for the following components:   APPearance CLOUDY (*)    Glucose, UA >=500 (*)    Protein, ur 30 (*)    Leukocytes,Ua MODERATE (*)    Bacteria, UA FEW (*)    Squamous Epithelial / LPF >50 (*)    All other components within normal limits  CBG MONITORING, ED - Abnormal; Notable for the following components:   Glucose-Capillary 309 (*)    All other components within normal limits  CBC  ETHANOL  PROTIME-INR  APTT  RAPID URINE DRUG SCREEN, HOSP PERFORMED  DIFFERENTIAL  I-STAT BETA HCG BLOOD, ED ([facility], [facility], [facility] ONLY)  TROPONIN I (HIGH SENSITIVITY)  TROPONIN I (HIGH SENSITIVITY)    EKG EKG Interpretation  Date/Time:  Monday October 09 2019 07:58:50 EDT Ventricular Rate:  72 PR Interval:    QRS Duration: 90 QT Interval:  379 QTC Calculation: 415 R Axis:   25 Text Interpretation: Sinus rhythm Low voltage, precordial leads No significant change since last tracing Confirmed by Dorie Rank [phone]) on 10/09/2019 8:02:26 AM   Radiology DG Chest 2 View  Result Date: 10/09/2019 CLINICAL DATA:  Chest pain EXAM: CHEST - 2 VIEW COMPARISON:  04/17/2010 FINDINGS: The heart size and mediastinal contours are within normal limits.  Amplatzer type occlusion device projects in the vicinity of the interatrial septum. Both lungs are clear. The visualized skeletal structures are unremarkable. IMPRESSION: No acute abnormality of the lungs. Electronically Signed   By: Eddie Candle M.D.   On: 10/09/2019 08:32   CT HEAD WO CONTRAST  Result Date: 10/09/2019 CLINICAL DATA:  Focal neuro deficit, greater than 6 hours, stroke suspected. Additional history provided: Patient reports numbness in bilateral lower extremities and chest pain, history of stroke 2 years ago, patient reportedly taking Eliquis EXAM: CT HEAD WITHOUT CONTRAST TECHNIQUE: Contiguous axial images were obtained from the base of the skull through the vertex without intravenous contrast. COMPARISON:  Head CT 12/12/2006 FINDINGS: Brain: There is no evidence of acute intracranial hemorrhage, intracranial mass, midline shift or extra-axial fluid collection.No demarcated cortical infarction. Cerebral volume is normal for age. Vascular: No hyperdense vessel. Atherosclerotic calcifications. Skull: Normal. Negative for fracture or focal lesion. Sinuses/Orbits: Visualized orbits demonstrate no acute abnormality. Large right maxillary sinus mucous retention cyst. Mild ethmoid sinus mucosal thickening. Frothy secretions within posterior left ethmoid air cells. No significant mastoid effusion. IMPRESSION: No evidence of acute intracranial abnormality. Paranasal sinus disease as described. This includes a large right maxillary sinus mucous retention cyst as well as frothy secretions within posterior left ethmoid air cells. Correlate for acute on chronic sinusitis. Electronically Signed   By: Kellie Simmering DO   On: 10/09/2019 09:04    Procedures Procedures (including critical care time)  Medications Ordered in ED Medications  sodium chloride 0.9 % bolus 500 mL (0 mLs Intravenous Stopped 10/09/19 1027)    Followed by  0.9 %  sodium chloride infusion (100 mL/hr Intravenous New Bag/Given 10/09/19  0841)  sodium chloride flush (NS) 0.9 % injection 3 mL (3 mLs Intravenous Given 10/09/19 0758)  ketorolac (TORADOL) 30 MG/ML injection 30 mg (30 mg Intravenous Given 10/09/19 0920)  potassium chloride SA (KLOR-CON) CR tablet 20 mEq (20 mEq Oral Given 10/09/19 1055)    ED Course  I have reviewed the triage vital signs and the nursing notes.  Pertinent labs & imaging results that were available during my care of the patient were reviewed by me and considered in my medical decision making (see chart for details).  Clinical Course as of Oct 09 1215  Mon Oct 09, 2019  0913 CT scan without acute findings.  CXR negative.  Hyperglycemia noted.  UA abnormal but contaminated   [JK]    Clinical Course User Index [JK] Linwood Dibbles, MD   MDM Rules/Calculators/A&P HEAR Score: 3             NIH Stroke Scale: 0       Patient presented to ED for evaluation of chest pain as well as numbness in her bilateral lower extremities.  Patient's physical exam is reassuring.  She has normal pulses.  Her NIH stroke scale was 0.  She does have history of prior stroke per the patient but her ED work-up is reassuring and the symptoms are atypical for acute stroke.  Patient does have cardiac risk factors but is overall low risk heart score and her troponins are negative.  At this time I doubt acute coronary syndrome, pulmonary embolism, pneumonia, aortic dissection or other serious pathology.  Patient appears stable for outpatient follow-up with her primary care doctor and I will give her a referral to a cardiologist (patient states her old cardiologist was in another area).  Warning signs and precautions discussed. Final Clinical Impression(s) / ED Diagnoses. Final diagnoses:  Numbness  Chest pain, unspecified type    Rx / DC Orders ED Discharge Orders    None       Linwood Dibbles, MD 10/09/19 1218

## 2019-10-09 NOTE — Discharge Instructions (Signed)
Follow-up with your primary care doctor and schedule appointment with a cardiologist as we discussed.  Return to the ED as needed for worsening symptoms.

## 2019-10-09 NOTE — ED Notes (Signed)
Patient transported to CT 

## 2019-10-09 NOTE — ED Triage Notes (Signed)
Pt BIB [facility] from home. Pt complaining of numbness in bilateral lower extremities and chest pain. Pt with history of stroke 2 years ago, pt takes eliquis.VSS. NAD.

## 2019-10-11 ENCOUNTER — Ambulatory Visit: Payer: Self-pay | Admitting: Family Medicine

## 2019-10-11 NOTE — Progress Notes (Deleted)
  Tina [name] - 49 y.o. female MRN [id number]  Date of birth: [birth year]  SUBJECTIVE:  Including CC & ROS.  No chief complaint on file.   Tina [name] is a 49 y.o. female that is  ***.  ***   Review of Systems See HPI   HISTORY: Past Medical, Surgical, Social, and Family History Reviewed & Updated per EMR.   Pertinent Historical Findings include:  Past Medical History:  Diagnosis Date  . Atrial fibrillation ([facility])   . Diabetes mellitus without complication ([facility])   . TIA (transient ischemic attack) 2017    Past Surgical History:  Procedure Laterality Date  . CARDIAC SURGERY  10/2016   repair PFO    No family history on file.  Social History   Socioeconomic History  . Marital status: Single    Spouse name: Not on file  . Number of children: Not on file  . Years of education: Not on file  . Highest education level: Not on file  Occupational History  . Not on file  Tobacco Use  . Smoking status: Current Every Day Smoker    Packs/day: 0.25    Years: 6.00    Pack years: 1.50    Types: Cigarettes  . Smokeless tobacco: Never Used  Substance and Sexual Activity  . Alcohol use: Not Currently  . Drug use: Never  . Sexual activity: Not on file  Other Topics Concern  . Not on file  Social History Narrative  . Not on file   Social Determinants of Health   Financial Resource Strain:   . Difficulty of Paying Living Expenses:   Food Insecurity:   . Worried About Programme researcher, broadcasting/film/video in the Last Year:   . Barista in the Last Year:   Transportation Needs:   . Freight forwarder (Medical):   Marland Kitchen Lack of Transportation (Non-Medical):   Physical Activity:   . Days of Exercise per Week:   . Minutes of Exercise per Session:   Stress:   . Feeling of Stress :   Social Connections:   . Frequency of Communication with Friends and Family:   . Frequency of Social Gatherings with Friends and Family:   . Attends Religious Services:   . Active Member of Clubs  or Organizations:   . Attends Banker Meetings:   Marland Kitchen Marital Status:   Intimate Partner Violence:   . Fear of Current or Ex-Partner:   . Emotionally Abused:   Marland Kitchen Physically Abused:   . Sexually Abused:      PHYSICAL EXAM:  VS: There were no vitals taken for this visit. Physical Exam Gen: NAD, alert, cooperative with exam, well-appearing MSK:  ***      ASSESSMENT & PLAN:   No problem-specific Assessment & Plan notes found for this encounter.

## 2019-10-23 NOTE — Progress Notes (Signed)
Cardiology Office Note:    Date:  10/25/2019   ID:  Tina [name], DOB January 04, 1971, MRN [id number]  PCP:  Patient, No Pcp Per  Cardiologist:  No primary care provider on file.  Electrophysiologist:  None   Referring MD: Dorie Rank, MD   Chief Complaint  Patient presents with  . Chest Pain    History of Present Illness:    Tina [name] is a 49 y.o. female with a hx of atrial fibrillation, CVA, ASD status post repair presents as a hospital follow-up for chest pain.  She was seen in the ED for chest pain and bilateral lower extremity numbness on 10/09/2019.  Troponins negative.  She has a history of a CVA status post thrombolytic therapy.  Found to have ASD and underwent transcatheter closure.  On postop day 1, she was found to be in atrial fibrillation.  Started on anticoagulation with Eliquis.  Previously was following with cardiology at [facility], but has not been seen since 2018.  She reports that she has been off anticoagulation since 2018.  Denies any history of bleeding issues on anticoagulation.  States that she feels palpitations when she is in atrial fibrillation, and has felt like she has had similar episodes of heart racing about 1-2 times per week.  Typically only lasts for about 2 minutes and resolves.  Reports that she is having daily chest pain.  States that it started in mid March.  Describes a sharp pain in center of her chest, lasts for few minutes and resolves.  Occurs multiple times per day.  Can occur at rest, but does note that it seems to be caused by exertion.  Has noted it when walking upstairs or going for walks outside.  She typically goes for a brisk walk at least twice weekly for about an hour.  She has had to intermittently stop during her walks due to chest pain.  States that chest pain resolves with rest.  She is smoking about 1/3 PPD.  Has smoked for 24 years.   Past Medical History:  Diagnosis Date  . Atrial fibrillation ([place])   . Diabetes mellitus without  complication ([place])   . TIA (transient ischemic attack) 2017    Past Surgical History:  Procedure Laterality Date  . CARDIAC SURGERY  10/2016   repair PFO    Current Medications: Current Meds  Medication Sig  . benzonatate (TESSALON) 100 MG capsule Take 1 capsule (100 mg total) by mouth every 8 (eight) hours.  Marland Kitchen etodolac (LODINE) 300 MG capsule Take 1 capsule (300 mg total) by mouth every 8 (eight) hours. (Patient taking differently: Take 300 mg by mouth every 8 (eight) hours as needed for mild pain. )  . fluticasone (FLONASE) 50 MCG/ACT nasal spray Place 1 spray into both nostrils daily.  Marland Kitchen guaiFENesin (MUCINEX) 600 MG 12 hr tablet Take 1 tablet (600 mg total) by mouth 2 (two) times daily as needed.  Marland Kitchen HYDROcodone-acetaminophen (NORCO/VICODIN) 5-325 MG tablet Take 1 tablet by mouth every 4 (four) hours as needed.  Marland Kitchen HYDROcodone-acetaminophen (NORCO/VICODIN) 5-325 MG tablet Take 1-2 tablets by mouth every 6 (six) hours as needed for moderate pain.  Marland Kitchen ibuprofen (ADVIL,MOTRIN) 600 MG tablet Take 1 tablet (600 mg total) by mouth every 6 (six) hours as needed.  . Multiple Vitamins-Minerals (MULTIVITAMIN ADULTS PO) Take 1 tablet by mouth daily.   . sodium chloride (OCEAN) 0.65 % SOLN nasal spray Place 2 sprays into both nostrils as needed for congestion.     Allergies:  Bee venom and Mushroom extract complex   Social History   Socioeconomic History  . Marital status: Single    Spouse name: Not on file  . Number of children: Not on file  . Years of education: Not on file  . Highest education level: Not on file  Occupational History  . Not on file  Tobacco Use  . Smoking status: Current Every Day Smoker    Packs/day: 0.25    Years: 6.00    Pack years: 1.50    Types: Cigarettes  . Smokeless tobacco: Never Used  Substance and Sexual Activity  . Alcohol use: Not Currently  . Drug use: Never  . Sexual activity: Not on file  Other Topics Concern  . Not on file  Social History  Narrative  . Not on file   Social Determinants of Health   Financial Resource Strain:   . Difficulty of Paying Living Expenses:   Food Insecurity:   . Worried About Programme researcher, broadcasting/film/videounning Out of Food in the Last Year:   . Baristaan Out of Food in the Last Year:   Transportation Needs:   . Freight forwarderLack of Transportation (Medical):   Marland Kitchen. Lack of Transportation (Non-Medical):   Physical Activity:   . Days of Exercise per Week:   . Minutes of Exercise per Session:   Stress:   . Feeling of Stress :   Social Connections:   . Frequency of Communication with Friends and Family:   . Frequency of Social Gatherings with Friends and Family:   . Attends Religious Services:   . Active Member of Clubs or Organizations:   . Attends BankerClub or Organization Meetings:   Marland Kitchen. Marital Status:      Family History: The patient's family history is not on file.  ROS:   Please see the history of present illness.     All other systems reviewed and are negative.  EKGs/Labs/Other Studies Reviewed:    The following studies were reviewed today:   EKG:  EKG is  ordered today.  The ekg ordered today demonstrates normal sinus rhythm, rate 71, low voltage, no ST/T abnormalities  Recent Labs: 10/09/2019: BUN <5; Creatinine, Ser 0.48; Hemoglobin 14.4; Platelets 193; Potassium 3.2; Sodium 138  Recent Lipid Panel    Component Value Date/Time   CHOL  10/23/2008 0110    136        ATP III CLASSIFICATION:  <200     mg/dL   Desirable  147-829200-239  mg/dL   Borderline High  >=562>=240    mg/dL   High          TRIG 130223 (H) 10/23/2008 0110   HDL 24 (L) 10/23/2008 0110   CHOLHDL 5.7 10/23/2008 0110   VLDL 45 (H) 10/23/2008 0110   LDLCALC  10/23/2008 0110    67        Total Cholesterol/HDL:CHD Risk Coronary Heart Disease Risk Table                     Men   Women  1/2 Average Risk   3.4   3.3  Average Risk       5.0   4.4  2 X Average Risk   9.6   7.1  3 X Average Risk  23.4   11.0        Use the calculated Patient Ratio above and the CHD Risk  Table to determine the patient's CHD Risk.        ATP III CLASSIFICATION (LDL):  <  100     mg/dL   Optimal  841-660  mg/dL   Near or Above                    Optimal  130-159  mg/dL   Borderline  630-160  mg/dL   High  >109     mg/dL   Very High    Physical Exam:    VS:  BP 140/80   Pulse 71   Temp (!) 96.8 F (36 C)   Ht 5\' 7"  (1.702 m)   Wt 139 lb 9.6 oz (63.3 kg)   BMI 21.86 kg/m     Wt Readings from Last 3 Encounters:  10/25/19 139 lb 9.6 oz (63.3 kg)  10/09/19 140 lb (63.5 kg)  09/13/19 135 lb (61.2 kg)     GEN:  Well nourished, well developed in no acute distress HEENT: Normal NECK: No JVD LYMPHATICS: No lymphadenopathy CARDIAC: RRR, no murmurs, rubs, gallops RESPIRATORY:  Clear to auscultation without rales, wheezing or rhonchi  ABDOMEN: Soft, non-tender, non-distended MUSCULOSKELETAL:  No edema; No deformity  SKIN: Warm and dry NEUROLOGIC:  Alert and oriented x 3 PSYCHIATRIC:  Normal affect   ASSESSMENT:    1. Atrial fibrillation, unspecified type (HCC)   2. Chest pain of uncertain etiology   3. Pre-procedure lab exam   4. Tobacco use    PLAN:     Chest pain: Atypical in description, but has been occurring with exertion and does have risk factors for CAD (tobacco use).  Will evaluate further with coronary CTA  Paroxysmal atrial fibrillation: CHA2DS2-VASc score 5 (hypertension, diabetes, stroke, female).  Reports palpitations where she feels like her heart is racing, occurring twice per week -Start Eliquis 5 mg twice daily -Start Toprol-XL 25 mg daily -Check TTE -Zio patch x1 week  ASD: status post closure with 25 mm cribriform Amplatzer septal occluder device in April 2018  Tobacco use: Patient counseled on the risk of tobacco use and cessation strongly encouraged.  RTC in 3 months  Medication Adjustments/Labs and Tests Ordered: Current medicines are reviewed at length with the patient today.  Concerns regarding medicines are outlined above.    Orders Placed This Encounter  Procedures  . CT CORONARY MORPH W/CTA COR W/SCORE W/CA W/CM &/OR WO/CM  . CT CORONARY FRACTIONAL FLOW RESERVE DATA PREP  . CT CORONARY FRACTIONAL FLOW RESERVE FLUID ANALYSIS  . Basic metabolic panel  . CBC  . LONG TERM MONITOR (3-14 DAYS)  . EKG 12-Lead  . ECHOCARDIOGRAM COMPLETE   Meds ordered this encounter  Medications  . metoprolol tartrate (LOPRESSOR) 100 MG tablet    Sig: Take 50 mg (1 tablet) TWO hours prior to CT    Dispense:  1 tablet    Refill:  0  . apixaban (ELIQUIS) 5 MG TABS tablet    Sig: Take 1 tablet (5 mg total) by mouth 2 (two) times daily.    Dispense:  180 tablet    Refill:  3  . metoprolol succinate (TOPROL XL) 25 MG 24 hr tablet    Sig: Take 1 tablet (25 mg total) by mouth daily.    Dispense:  90 tablet    Refill:  3    Patient Instructions  Medication Instructions:  RESTART Eliquis 5 mg two times daily START metoprolol succinate (Toprol XL) 25 mg daily   Morning of CT scan-HOLD Toprol XL and take metoprolol tartrate (Lopressor) 100 mg two hours prior  *If you need a refill on your  cardiac medications before your next appointment, please call your pharmacy*   Lab Work: BMET, CBC today  If you have labs (blood work) drawn today and your tests are completely normal, you will receive your results only by: Marland Kitchen MyChart Message (if you have MyChart) OR . A paper copy in the mail If you have any lab test that is abnormal or we need to change your treatment, we will call you to review the results.   Testing/Procedures: Your physician has requested that you have an echocardiogram. Echocardiography is a painless test that uses sound waves to create images of your heart. It provides your doctor with information about the size and shape of your heart and how well your heart's chambers and valves are working. This procedure takes approximately one hour. There are no restrictions for this procedure. This will be done at our  [facility] location:  [address]   ZIO XT- Long Term Monitor Instructions   Your physician has requested you wear your ZIO patch monitor 7 days.   This is a single patch monitor.  Ir[employer] supplies one patch monitor per enrollment.  Additional stickers are not available.   Please do not apply patch if you will be having a Nuclear Stress Test, Echocardiogram, Cardiac CT, MRI, or Chest Xray during the time frame you would be wearing the monitor. The patch cannot be worn during these tests.  You cannot remove and re-apply the ZIO XT patch monitor.   Your ZIO patch monitor will be sent USPS Priority mail from [facility] directly to your home address. The monitor may also be mailed to a PO BOX if home delivery is not available.   It may take 3-5 days to receive your monitor after you have been enrolled.   Once you have received you monitor, please review enclosed instructions.  Your monitor has already been registered assigning a specific monitor serial # to you.   Applying the monitor   Shave hair from upper left chest.   Hold abrader disc by orange tab.  Rub abrader in 40 strokes over left upper chest as indicated in your monitor instructions.   Clean area with 4 enclosed alcohol pads .  Use all pads to assure are is cleaned thoroughly.  Let dry.   Apply patch as indicated in monitor instructions.  Patch will be place under collarbone on left side of chest with arrow pointing upward.   Rub patch adhesive wings for 2 minutes.Remove white label marked "1".  Remove white label marked "2".  Rub patch adhesive wings for 2 additional minutes.   While looking in a mirror, press and release button in center of patch.  A small green light will flash 3-4 times .  This will be your only indicator the monitor has been turned on.     Do not shower for the first 24 hours.  You may shower after the first 24 hours.   Press button if you feel a symptom. You will hear a  small click.  Record Date, Time and Symptom in the Patient Log Book.   When you are ready to remove patch, follow instructions on last 2 pages of Patient Log Book.  Stick patch monitor onto last page of Patient Log Book.   Place Patient Log Book in [place] box.  Use locking tab on box and tape box closed securely.  The Orange and [employer] has [employer] on it.  Please place in mailbox as  soon as possible.  Your physician should have your test results approximately 7 days after the monitor has been mailed back to [facility].   Call [facility] Customer Care at [phone] if you have questions regarding your ZIO XT patch monitor.  Call them immediately if you see an orange light blinking on your monitor.   If your monitor falls off in less than 4 days contact our Monitor department at [phone].  If your monitor becomes loose or falls off after 4 days call [employer] at [phone] for suggestions on securing your monitor.   Your physician has requested that you have cardiac CT. Cardiac computed tomography (CT) is a painless test that uses an x-ray machine to take clear, detailed pictures of your heart. For further information please visit [email or web address] Please follow instruction sheet as given.   Follow-Up: At [facility], you and your health needs are our priority.  As part of our continuing mission to provide you with exceptional heart care, we have created designated Provider Care Teams.  These Care Teams include your primary Cardiologist (physician) and Advanced Practice Providers (APPs -  Physician Assistants and Nurse Practitioners) who all work together to provide you with the care you need, when you need it.  We recommend signing up for the patient portal called "MyChart".  Sign up information is provided on this After Visit Summary.  MyChart is used to connect with patients for Virtual Visits (Telemedicine).  Patients are able to view lab/test results, encounter  notes, upcoming appointments, etc.  Non-urgent messages can be sent to your provider as well.   To learn more about what you can do with MyChart, go to [email or web address].    Your next appointment:   3 month(s)  The format for your next appointment:   In Person  Provider:   Epifanio Lesches, MD         Signed, Little Ishikawa, MD  10/25/2019 5:37 PM    [facility]

## 2019-10-25 ENCOUNTER — Ambulatory Visit (INDEPENDENT_AMBULATORY_CARE_PROVIDER_SITE_OTHER): Payer: Self-pay | Admitting: Cardiology

## 2019-10-25 ENCOUNTER — Encounter: Payer: Self-pay | Admitting: Cardiology

## 2019-10-25 ENCOUNTER — Other Ambulatory Visit: Payer: Self-pay

## 2019-10-25 VITALS — BP 140/80 | HR 71 | Temp 96.8°F | Ht 67.0 in | Wt 139.6 lb

## 2019-10-25 DIAGNOSIS — Z01812 Encounter for preprocedural laboratory examination: Secondary | ICD-10-CM

## 2019-10-25 DIAGNOSIS — R079 Chest pain, unspecified: Secondary | ICD-10-CM

## 2019-10-25 DIAGNOSIS — Z720 Tobacco use: Secondary | ICD-10-CM

## 2019-10-25 DIAGNOSIS — I4891 Unspecified atrial fibrillation: Secondary | ICD-10-CM

## 2019-10-25 MED ORDER — METOPROLOL SUCCINATE ER 25 MG PO TB24: 25.0000 mg | ORAL_TABLET | Freq: Every day | ORAL | 3 refills | Status: DC

## 2019-10-25 MED ORDER — METOPROLOL TARTRATE 100 MG PO TABS: ORAL_TABLET | ORAL | 0 refills | Status: DC

## 2019-10-25 MED ORDER — APIXABAN 5 MG PO TABS: 5.0000 mg | ORAL_TABLET | Freq: Two times a day (BID) | ORAL | 3 refills | Status: DC

## 2019-10-25 NOTE — Patient Instructions (Signed)
Medication Instructions:  RESTART Eliquis 5 mg two times daily START metoprolol succinate (Toprol XL) 25 mg daily   Morning of CT scan-HOLD Toprol XL and take metoprolol tartrate (Lopressor) 100 mg two hours prior  *If you need a refill on your cardiac medications before your next appointment, please call your pharmacy*   Lab Work: BMET, CBC today  If you have labs (blood work) drawn today and your tests are completely normal, you will receive your results only by: Marland Kitchen MyChart Message (if you have MyChart) OR . A paper copy in the mail If you have any lab test that is abnormal or we need to change your treatment, we will call you to review the results.   Testing/Procedures: Your physician has requested that you have an echocardiogram. Echocardiography is a painless test that uses sound waves to create images of your heart. It provides your doctor with information about the size and shape of your heart and how well your heart's chambers and valves are working. This procedure takes approximately one hour. There are no restrictions for this procedure. This will be done at our [facility] location:  [place] has requested you wear your ZIO patch monitor 7 days.   This is a single patch monitor.  Irhythm supplies one patch monitor per enrollment.  Additional stickers are not available.   Please do not apply patch if you will be having a Nuclear Stress Test, Echocardiogram, Cardiac CT, MRI, or Chest Xray during the time frame you would be wearing the monitor. The patch cannot be worn during these tests.  You cannot remove and re-apply the ZIO XT patch monitor.   Your ZIO patch monitor will be sent USPS Priority mail from [facility] directly to your home address. The monitor may also be mailed to a PO BOX if home delivery is not available.   It may take 3-5 days to receive your monitor after you have  been enrolled.   Once you have received you monitor, please review enclosed instructions.  Your monitor has already been registered assigning a specific monitor serial # to you.   Applying the monitor   Shave hair from upper left chest.   Hold abrader disc by orange tab.  Rub abrader in 40 strokes over left upper chest as indicated in your monitor instructions.   Clean area with 4 enclosed alcohol pads .  Use all pads to assure are is cleaned thoroughly.  Let dry.   Apply patch as indicated in monitor instructions.  Patch will be place under collarbone on left side of chest with arrow pointing upward.   Rub patch adhesive wings for 2 minutes.Remove white label marked "1".  Remove white label marked "2".  Rub patch adhesive wings for 2 additional minutes.   While looking in a mirror, press and release button in center of patch.  A small green light will flash 3-4 times .  This will be your only indicator the monitor has been turned on.     Do not shower for the first 24 hours.  You may shower after the first 24 hours.   Press button if you feel a symptom. You will hear a small click.  Record Date, Time and Symptom in the Patient Log Book.   When you are ready to remove patch, follow instructions on last 2 pages of Patient Log Book.  Stick patch monitor onto  last page of Patient Log Book.   Place Patient Log Book in [place] box.  Use locking tab on box and tape box closed securely.  The [employer] and [employer] has [employer] on it.  Please place in mailbox as soon as possible.  Your physician should have your test results approximately 7 days after the monitor has been mailed back to [facility].   Call [facility] Customer Care at [phone] if you have questions regarding your ZIO XT patch monitor.  Call them immediately if you see an orange light blinking on your monitor.   If your monitor falls off in less than 4 days contact our Monitor department at [phone].  If your  monitor becomes loose or falls off after 4 days call [employer] at [phone] for suggestions on securing your monitor.   Your physician has requested that you have cardiac CT. Cardiac computed tomography (CT) is a painless test that uses an x-ray machine to take clear, detailed pictures of your heart. For further information please visit [email or web address] Please follow instruction sheet as given.   Follow-Up: At [facility], you and your health needs are our priority.  As part of our continuing mission to provide you with exceptional heart care, we have created designated Provider Care Teams.  These Care Teams include your primary Cardiologist (physician) and Advanced Practice Providers (APPs -  Physician Assistants and Nurse Practitioners) who all work together to provide you with the care you need, when you need it.  We recommend signing up for the patient portal called "MyChart".  Sign up information is provided on this After Visit Summary.  MyChart is used to connect with patients for Virtual Visits (Telemedicine).  Patients are able to view lab/test results, encounter notes, upcoming appointments, etc.  Non-urgent messages can be sent to your provider as well.   To learn more about what you can do with MyChart, go to [email or web address].    Your next appointment:   3 month(s)  The format for your next appointment:   In Person  Provider:   Epifanio Lesches, MD

## 2019-10-26 ENCOUNTER — Other Ambulatory Visit: Payer: Self-pay

## 2019-10-26 ENCOUNTER — Telehealth: Payer: Self-pay | Admitting: *Deleted

## 2019-10-26 LAB — BASIC METABOLIC PANEL
BUN/Creatinine Ratio: 9 (ref 9–23)
BUN: 5 mg/dL — ABNORMAL LOW (ref 6–24)
CO2: 22 mmol/L (ref 20–29)
Calcium: 10.3 mg/dL — ABNORMAL HIGH (ref 8.7–10.2)
Chloride: 102 mmol/L (ref 96–106)
Creatinine, Ser: 0.54 mg/dL — ABNORMAL LOW (ref 0.57–1.00)
GFR calc Af Amer: 131 mL/min/{1.73_m2} (ref >59)
GFR calc non Af Amer: 114 mL/min/{1.73_m2} (ref >59)
Glucose: 239 mg/dL — ABNORMAL HIGH (ref 65–99)
Potassium: 5.1 mmol/L (ref 3.5–5.2)
Sodium: 141 mmol/L (ref 134–144)

## 2019-10-26 LAB — CBC
Hematocrit: 45.7 % (ref 34.0–46.6)
Hemoglobin: 16.1 g/dL — ABNORMAL HIGH (ref 11.1–15.9)
MCH: 30.6 pg (ref 26.6–33.0)
MCHC: 35.2 g/dL (ref 31.5–35.7)
MCV: 87 fL (ref 79–97)
Platelets: 244 10*3/uL (ref 150–450)
RBC: 5.27 x10E6/uL (ref 3.77–5.28)
RDW: 12.3 % (ref 11.7–15.4)
WBC: 7.9 10*3/uL (ref 3.4–10.8)

## 2019-10-26 NOTE — Telephone Encounter (Signed)
Called patient to verify she had received her My Charge Message regarding her ZIO XT monitor.

## 2019-11-09 ENCOUNTER — Other Ambulatory Visit: Payer: Self-pay

## 2019-11-09 ENCOUNTER — Ambulatory Visit (HOSPITAL_COMMUNITY): Payer: Self-pay | Attending: Internal Medicine

## 2019-11-09 DIAGNOSIS — R079 Chest pain, unspecified: Secondary | ICD-10-CM | POA: Insufficient documentation

## 2019-11-09 DIAGNOSIS — I4891 Unspecified atrial fibrillation: Secondary | ICD-10-CM | POA: Insufficient documentation

## 2019-11-21 ENCOUNTER — Telehealth (HOSPITAL_COMMUNITY): Payer: Self-pay | Admitting: Emergency Medicine

## 2019-11-21 ENCOUNTER — Encounter (HOSPITAL_COMMUNITY): Payer: Self-pay

## 2019-11-21 NOTE — Telephone Encounter (Signed)
Reaching out to patient to offer assistance regarding upcoming cardiac imaging study; pt verbalizes understanding of appt date/time, parking situation and where to check in, pre-test NPO status and medications ordered, and verified current allergies; name and call back number provided for further questions should they arise Rockwell Alexandria RN Navigator Cardiac Imaging [facility] [phone] office [phone] cell   Pt with nipple piercings, advised that they would need to be removed prior to scan. Pt states they are difficult to remove but will try.   Will send instructions to mychart per pts reuqest.  [place] Dec

## 2019-11-22 ENCOUNTER — Other Ambulatory Visit: Payer: Self-pay

## 2019-11-22 ENCOUNTER — Ambulatory Visit (HOSPITAL_COMMUNITY)
Admission: RE | Admit: 2019-11-22 | Discharge: 2019-11-22 | Disposition: A | Discharge status: Home / Self Care | Payer: Self-pay | Source: Ambulatory Visit | Attending: Cardiology | Admitting: Cardiology

## 2019-11-22 DIAGNOSIS — R079 Chest pain, unspecified: Secondary | ICD-10-CM

## 2019-11-22 MED ORDER — IOHEXOL 350 MG/ML SOLN
100.0000 mL | Freq: Once | INTRAVENOUS | Status: AC | PRN
  Administered 2019-11-22: 100 mL via INTRAVENOUS

## 2019-11-22 MED ORDER — METOPROLOL TARTRATE 5 MG/5ML IV SOLN
INTRAVENOUS | Status: AC
  Filled 2019-11-22: qty 10

## 2019-11-22 MED ORDER — NITROGLYCERIN 0.4 MG SL SUBL
SUBLINGUAL_TABLET | SUBLINGUAL | Status: AC
  Administered 2019-11-22: 0.8 mg via SUBLINGUAL
  Filled 2019-11-22: qty 2

## 2019-11-22 MED ORDER — METOPROLOL TARTRATE 5 MG/5ML IV SOLN
5.0000 mg | INTRAVENOUS | Status: DC | PRN
  Administered 2019-11-22: 5 mg via INTRAVENOUS
  Administered 2019-11-22: 2.5 mg via INTRAVENOUS

## 2019-11-22 MED ORDER — NITROGLYCERIN 0.4 MG SL SUBL: 0.8000 mg | SUBLINGUAL_TABLET | Freq: Once | SUBLINGUAL | Status: AC

## 2019-11-23 ENCOUNTER — Other Ambulatory Visit: Payer: Self-pay | Admitting: *Deleted

## 2019-11-23 DIAGNOSIS — R911 Solitary pulmonary nodule: Secondary | ICD-10-CM

## 2019-11-23 DIAGNOSIS — Z720 Tobacco use: Secondary | ICD-10-CM

## 2020-01-23 ENCOUNTER — Ambulatory Visit: Payer: Self-pay | Admitting: Cardiology

## 2020-03-11 ENCOUNTER — Telehealth: Payer: Self-pay | Admitting: Cardiology

## 2020-03-11 NOTE — Telephone Encounter (Signed)
Attempted to contact patient to scheduled follow up with  [facility] , no answer and unable to lvm

## 2020-04-11 ENCOUNTER — Encounter: Payer: Self-pay | Admitting: General Practice

## 2020-10-22 NOTE — Telephone Encounter (Signed)
Monitor was never returned and marked "lost" in Zio system. Order will be cancelled.  

## 2020-11-21 ENCOUNTER — Telehealth: Payer: Self-pay | Admitting: Cardiology

## 2020-11-21 NOTE — Telephone Encounter (Signed)
Called to discuss scheduling the CT chest w/o contrast ordered by Dr. Bjorn Pippin.  No answer and no voice mail  Is se tup.  Will continure to try and reach patient by phone.

## 2020-11-25 ENCOUNTER — Telehealth: Payer: Self-pay | Admitting: Cardiology

## 2020-11-25 NOTE — Telephone Encounter (Signed)
Called to discuss scheduling options for the CT chest (and confirm insurance information) ordered by Dr. Andee Poles did not answer and no voice mail was set up---will send My Chart message

## 2021-02-19 DIAGNOSIS — Z8673 Personal history of transient ischemic attack (TIA), and cerebral infarction without residual deficits: Secondary | ICD-10-CM | POA: Insufficient documentation

## 2021-02-19 DIAGNOSIS — E785 Hyperlipidemia, unspecified: Secondary | ICD-10-CM | POA: Insufficient documentation

## 2021-02-19 DIAGNOSIS — Q211 Atrial septal defect, unspecified: Secondary | ICD-10-CM | POA: Insufficient documentation

## 2021-02-19 DIAGNOSIS — F17210 Nicotine dependence, cigarettes, uncomplicated: Secondary | ICD-10-CM | POA: Insufficient documentation

## 2021-02-19 DIAGNOSIS — Z8679 Personal history of other diseases of the circulatory system: Secondary | ICD-10-CM | POA: Insufficient documentation

## 2021-02-19 DIAGNOSIS — E119 Type 2 diabetes mellitus without complications: Secondary | ICD-10-CM | POA: Insufficient documentation

## 2021-02-19 DIAGNOSIS — I10 Essential (primary) hypertension: Secondary | ICD-10-CM | POA: Insufficient documentation

## 2021-02-19 DIAGNOSIS — Z8619 Personal history of other infectious and parasitic diseases: Secondary | ICD-10-CM | POA: Insufficient documentation

## 2021-02-19 DIAGNOSIS — Q2112 Patent foramen ovale: Secondary | ICD-10-CM | POA: Insufficient documentation

## 2021-02-20 ENCOUNTER — Ambulatory Visit: Payer: Self-pay | Admitting: Internal Medicine

## 2021-02-20 DIAGNOSIS — E785 Hyperlipidemia, unspecified: Secondary | ICD-10-CM

## 2021-02-20 DIAGNOSIS — Z8619 Personal history of other infectious and parasitic diseases: Secondary | ICD-10-CM

## 2021-02-20 DIAGNOSIS — I10 Essential (primary) hypertension: Secondary | ICD-10-CM

## 2021-02-20 DIAGNOSIS — Q211 Atrial septal defect: Secondary | ICD-10-CM

## 2021-02-20 DIAGNOSIS — F17210 Nicotine dependence, cigarettes, uncomplicated: Secondary | ICD-10-CM

## 2021-02-20 DIAGNOSIS — Z8673 Personal history of transient ischemic attack (TIA), and cerebral infarction without residual deficits: Secondary | ICD-10-CM

## 2021-02-20 DIAGNOSIS — E1169 Type 2 diabetes mellitus with other specified complication: Secondary | ICD-10-CM

## 2021-02-20 DIAGNOSIS — Z8679 Personal history of other diseases of the circulatory system: Secondary | ICD-10-CM

## 2021-02-27 ENCOUNTER — Ambulatory Visit: Payer: 59 | Admitting: Internal Medicine

## 2021-06-16 ENCOUNTER — Emergency Department (HOSPITAL_COMMUNITY): Payer: 59

## 2021-06-16 ENCOUNTER — Emergency Department (HOSPITAL_COMMUNITY)
Admission: EM | Admit: 2021-06-16 | Discharge: 2021-06-16 | Disposition: A | Discharge status: Home / Self Care | Payer: 59 | Attending: Emergency Medicine | Admitting: Emergency Medicine

## 2021-06-16 ENCOUNTER — Other Ambulatory Visit: Payer: Self-pay

## 2021-06-16 DIAGNOSIS — E119 Type 2 diabetes mellitus without complications: Secondary | ICD-10-CM | POA: Diagnosis not present

## 2021-06-16 DIAGNOSIS — I10 Essential (primary) hypertension: Secondary | ICD-10-CM | POA: Diagnosis not present

## 2021-06-16 DIAGNOSIS — Z7901 Long term (current) use of anticoagulants: Secondary | ICD-10-CM | POA: Diagnosis not present

## 2021-06-16 DIAGNOSIS — M2391 Unspecified internal derangement of right knee: Secondary | ICD-10-CM

## 2021-06-16 DIAGNOSIS — Z79899 Other long term (current) drug therapy: Secondary | ICD-10-CM | POA: Insufficient documentation

## 2021-06-16 DIAGNOSIS — F17210 Nicotine dependence, cigarettes, uncomplicated: Secondary | ICD-10-CM | POA: Insufficient documentation

## 2021-06-16 DIAGNOSIS — M25561 Pain in right knee: Secondary | ICD-10-CM | POA: Diagnosis present

## 2021-06-16 MED ORDER — IBUPROFEN 600 MG PO TABS: 600.0000 mg | ORAL_TABLET | Freq: Four times a day (QID) | ORAL | 0 refills | Status: AC | PRN

## 2021-06-16 MED ORDER — HYDROCODONE-ACETAMINOPHEN 5-325 MG PO TABS: 1.0000 | ORAL_TABLET | ORAL | 0 refills | Status: DC | PRN

## 2021-06-16 MED ORDER — HYDROCODONE-ACETAMINOPHEN 5-325 MG PO TABS
1.0000 | ORAL_TABLET | Freq: Once | ORAL | Status: AC
  Administered 2021-06-16: 1 via ORAL
  Filled 2021-06-16: qty 1

## 2021-06-16 MED ORDER — MORPHINE SULFATE (PF) 4 MG/ML IV SOLN
4.0000 mg | Freq: Once | INTRAVENOUS | Status: AC
Start: 2021-06-16 — End: 2021-06-16
  Administered 2021-06-16: 4 mg via INTRAMUSCULAR
  Filled 2021-06-16: qty 1

## 2021-06-16 NOTE — ED Notes (Signed)
Pt alert and oriented, reports "my right knee is locked up and I'm over it."

## 2021-06-16 NOTE — Progress Notes (Signed)
Orthopedic Tech Progress Note Patient Details:  Tina [name] [birth year] [id number]  Ortho Devices Type of Ortho Device: Crutches, Knee Sleeve Ortho Device/Splint Location: RLE Ortho Device/Splint Interventions: Ordered, Application, Adjustment   Post Interventions Patient Tolerated: Well Instructions Provided: Adjustment of device, Care of device, Poper ambulation with device  Dam Ashraf 06/16/2021, 9:46 PM

## 2021-06-16 NOTE — ED Triage Notes (Signed)
Pt with hx of knee problems and followed by orthopedist presents for eval of R knee immobility and pain after bending it at work and hearing something pop. Pt now unable to move knee. No fall or other injury.

## 2021-06-16 NOTE — ED Provider Notes (Signed)
[facility] EMERGENCY DEPARTMENT Provider Note   CSN: [id number] Arrival date & time: 06/16/21  1533     History Chief Complaint  Patient presents with   Knee Pain    Tina [name] is a 50 y.o. female.  Pt presents to the ED today with right knee pain.  Pt has a hx of a meniscal tear in the past.  She was supposed to have surgery to repair, but she was nervous about the surgery and did not get it done.  She was working today and bent her knee.  She felt a pop and had severe pain and now can't extend her knee.  She denies any other injuries.      Past Medical History:  Diagnosis Date   Atrial fibrillation (HCC)    Diabetes mellitus without complication (HCC)    TIA (transient ischemic attack) 2017    Patient Active Problem List   Diagnosis Date Noted   History of syphilis 02/19/2021   Diabetes mellitus (HCC) 02/19/2021   Dyslipidemia 02/19/2021   Hypertension 02/19/2021   History of atrial fibrillation 02/19/2021   Atrial septal defect 02/19/2021   Patent foramen ovale 02/19/2021   History of CVA (cerebrovascular accident) 02/19/2021   Cigarette smoker 02/19/2021    Past Surgical History:  Procedure Laterality Date   CARDIAC SURGERY  10/2016   repair PFO     OB History   No obstetric history on file.     No family history on file.  Social History   Tobacco Use   Smoking status: Every Day    Packs/day: 0.25    Years: 6.00    Pack years: 1.50    Types: Cigarettes   Smokeless tobacco: Never  Vaping Use   Vaping Use: Never used  Substance Use Topics   Alcohol use: Not Currently   Drug use: Never    Home Medications Prior to Admission medications   Medication Sig Start Date End Date Taking? Authorizing Provider  Multiple Vitamins-Minerals (MULTIVITAMIN ADULTS PO) Take 1 tablet by mouth daily.    Yes [provider]  apixaban (ELIQUIS) 5 MG TABS tablet Take 1 tablet (5 mg total) by mouth 2 (two) times daily. Patient not  taking: Reported on 06/16/2021 10/25/19   Little Ishikawa, MD  benzonatate (TESSALON) 100 MG capsule Take 1 capsule (100 mg total) by mouth every 8 (eight) hours. Patient not taking: Reported on 06/16/2021 08/08/19   Darr, Gerilyn Pilgrim, PA-C  etodolac (LODINE) 300 MG capsule Take 1 capsule (300 mg total) by mouth every 8 (eight) hours. Patient not taking: Reported on 06/16/2021 08/25/19   Linwood Dibbles, MD  fluticasone [facility]) 50 MCG/ACT nasal spray Place 1 spray into both nostrils daily. Patient not taking: Reported on 06/16/2021 05/15/19   Mickie Bail, NP  guaiFENesin (MUCINEX) 600 MG 12 hr tablet Take 1 tablet (600 mg total) by mouth 2 (two) times daily as needed. Patient not taking: Reported on 06/16/2021 08/08/19   Darr, Gerilyn Pilgrim, PA-C  HYDROcodone-acetaminophen (NORCO/VICODIN) 5-325 MG tablet Take 1 tablet by mouth every 4 (four) hours as needed. 06/16/21  Yes Jacalyn Lefevre, MD  ibuprofen (ADVIL) 600 MG tablet Take 1 tablet (600 mg total) by mouth every 6 (six) hours as needed. 06/16/21  Yes Jacalyn Lefevre, MD  metoprolol succinate (TOPROL XL) 25 MG 24 hr tablet Take 1 tablet (25 mg total) by mouth daily. Patient not taking: Reported on 06/16/2021 10/25/19   Little Ishikawa, MD  metoprolol tartrate (LOPRESSOR) 100 MG  tablet Take 50 mg (1 tablet) TWO hours prior to CT Patient not taking: Reported on 06/16/2021 10/25/19   Little Ishikawa, MD  sodium chloride (OCEAN) 0.65 % SOLN nasal spray Place 2 sprays into both nostrils as needed for congestion. Patient not taking: Reported on 06/16/2021 08/08/19   Darr, Gerilyn Pilgrim, PA-C    Allergies    Bee venom and Mushroom extract complex  Review of Systems   Review of Systems  Musculoskeletal:        Right knee pain  All other systems reviewed and are negative.  Physical Exam Updated Vital Signs BP (!) 164/112   Pulse (!) 102   Temp 98.1 F (36.7 C) (Oral)   Resp 16   SpO2 99%   Physical Exam Vitals and nursing note reviewed.   Constitutional:      Appearance: Normal appearance.  HENT:     Head: Normocephalic and atraumatic.     Right Ear: External ear normal.     Left Ear: External ear normal.     Nose: Nose normal.     Mouth/Throat:     Mouth: Mucous membranes are moist.     Pharynx: Oropharynx is clear.  Eyes:     Extraocular Movements: Extraocular movements intact.     Conjunctiva/sclera: Conjunctivae normal.     Pupils: Pupils are equal, round, and reactive to light.  Cardiovascular:     Rate and Rhythm: Normal rate and regular rhythm.     Pulses: Normal pulses.     Heart sounds: Normal heart sounds.  Pulmonary:     Effort: Pulmonary effort is normal.     Breath sounds: Normal breath sounds.  Abdominal:     General: Abdomen is flat. Bowel sounds are normal.     Palpations: Abdomen is soft.  Musculoskeletal:     Cervical back: Normal range of motion and neck supple.     Comments: Right knee locked at 90 degrees  Skin:    General: Skin is warm.     Capillary Refill: Capillary refill takes less than 2 seconds.  Neurological:     General: No focal deficit present.     Mental Status: She is alert and oriented to person, place, and time.  Psychiatric:        Mood and Affect: Mood normal.        Behavior: Behavior normal.    ED Results / Procedures / Treatments   Labs (all labs ordered are listed, but only abnormal results are displayed) Labs Reviewed - No data to display  EKG None  Radiology DG Knee Complete 4 Views Right  Result Date: 06/16/2021 CLINICAL DATA:  Right knee pain. EXAM: RIGHT KNEE - COMPLETE 4+ VIEW COMPARISON:  August 25, 2019 FINDINGS: The right knee is locked in a flexed position. This is also seen on the prior study. No evidence of an acute fracture, dislocation, or joint effusion. No evidence of arthropathy or other focal bone abnormality. Soft tissues are unremarkable. IMPRESSION: Right knee locked in a flexed position, without evidence of acute osseous abnormality.  This may represent sequelae associated with a displaced meniscal tear. Nonemergent MRI correlation is recommended. Electronically Signed   By: Aram Candela M.D.   On: 06/16/2021 16:48    Procedures Procedures   Medications Ordered in ED Medications  morphine 4 MG/ML injection 4 mg (4 mg Intramuscular Given 06/16/21 2021)    ED Course  I have reviewed the triage vital signs and the nursing notes.  Pertinent labs &  imaging results that were available during my care of the patient were reviewed by me and considered in my medical decision making (see chart for details).    MDM Rules/Calculators/A&P                           After pain meds, pt was able to get her knee nearly straight.  She is placed in a knee immobilizer and given crutches.  She saw Dr. Ophelia Charter in March of last year and will f/u with him again.  Pt is stable for d/c.  Return if worse. Final Clinical Impression(s) / ED Diagnoses Final diagnoses:  Locked knee, right  Internal derangement of right knee    Rx / DC Orders ED Discharge Orders          Ordered    HYDROcodone-acetaminophen (NORCO/VICODIN) 5-325 MG tablet  Every 4 hours PRN        06/16/21 2056    ibuprofen (ADVIL) 600 MG tablet  Every 6 hours PRN        06/16/21 2056             Jacalyn Lefevre, MD 06/16/21 2058

## 2021-06-16 NOTE — ED Provider Notes (Signed)
Emergency Medicine Provider Triage Evaluation Note  Jaidence Geisler , a 50 y.o. female  was evaluated in triage.  Pt complains of right knee pain that started pta. Has hx meniscal injury and was supposed to have surgery last year.  Review of Systems  Positive: Knee pain Negative: fever  Physical Exam  BP (!) 156/115 (BP Location: Right Arm)   Pulse (!) 102   Temp 98.1 F (36.7 C) (Oral)   Resp 16   SpO2 99%  Gen:   Awake, no distress   Resp:  Normal effort  MSK:   Moves extremities without difficulty  Other:  Knee held in flexion  Medical Decision Making  Medically screening exam initiated at 4:01 PM.  Appropriate orders placed.  Christien Beske was informed that the remainder of the evaluation will be completed by another provider, this initial triage assessment does not replace that evaluation, and the importance of remaining in the ED until their evaluation is complete.     Rayne Du 06/16/21 1602    Benjiman Core, MD 06/17/21 1244

## 2021-06-27 ENCOUNTER — Ambulatory Visit (INDEPENDENT_AMBULATORY_CARE_PROVIDER_SITE_OTHER): Payer: 59 | Admitting: Orthopaedic Surgery

## 2021-06-27 ENCOUNTER — Telehealth: Payer: Self-pay | Admitting: Orthopaedic Surgery

## 2021-06-27 ENCOUNTER — Other Ambulatory Visit: Payer: Self-pay

## 2021-06-27 ENCOUNTER — Encounter: Payer: Self-pay | Admitting: Orthopaedic Surgery

## 2021-06-27 VITALS — BP 161/104 | HR 88 | Ht 67.0 in | Wt 135.0 lb

## 2021-06-27 DIAGNOSIS — M2391 Unspecified internal derangement of right knee: Secondary | ICD-10-CM

## 2021-06-27 MED ORDER — HYDROCODONE-ACETAMINOPHEN 5-325 MG PO TABS: 1.0000 | ORAL_TABLET | ORAL | 0 refills | Status: DC | PRN

## 2021-06-27 NOTE — Progress Notes (Signed)
Office Visit Note   Patient: Tina [name]           Date of Birth: [birth year]           MRN: [id number] Visit Date: 06/27/2021              Requested by: No referring provider defined for this encounter. PCP: Patient, No Pcp Per (Inactive)   Assessment & Plan: Visit Diagnoses:  1. Locking of right knee   2. Locked knee, right     Plan: Patient's had recurrent locking in her knee which required manipulation emergency room.  MRI scan indicated.  Office follow-up after scan.  Will place in a short knee immobilizer so she can keep her knee from locking she is not driving.  Office follow-up after MRI scan.  Follow-Up Instructions: No follow-ups on file.   Orders:  Orders Placed This Encounter  Procedures   MR Knee Right w/o contrast   Meds ordered this encounter  Medications   HYDROcodone-acetaminophen (NORCO/VICODIN) 5-325 MG tablet    Sig: Take 1 tablet by mouth every 4 (four) hours as needed.    Dispense:  10 tablet    Refill:  0      Procedures: No procedures performed   Clinical Data: No additional findings.   Subjective: Chief Complaint  Patient presents with   Right Knee - Pain    DOI 06/16/2021    HPI 50 year old female states she was working from home sitting in a chair her right leg was folded and when she went to move she felt a pop in her knee could not straighten her knee.  ER required medication sedation and then extended her knee with a pop.  She has had pain with weightbearing.  She is used hydrocodone in the past states that she is out.  She has had swelling has been using crutches as well as a knee brace.  Locked knee ER visit was on 06/16/2021 where the knee was manipulated after morphine injection.  Patient was seen in 2021 with locked knee symptoms.  No MRI was done due to lack of insurance.  In the last year her knee is locked couple times until it locks severely and she had go to the emergency room.  Review of Systems history of CVA,  hypertension, diabetes cigarette smoker.  All other systems noncontributory to HPI.   Objective: Vital Signs: BP (!) 161/104    Pulse 88    Ht 5\' 7"  (1.702 m)    Wt 135 lb (61.2 kg)    BMI 21.14 kg/m   Physical Exam Constitutional:      Appearance: She is well-developed.  HENT:     Head: Normocephalic.     Right Ear: External ear normal.     Left Ear: External ear normal. There is no impacted cerumen.  Eyes:     Pupils: Pupils are equal, round, and reactive to light.  Neck:     Thyroid: No thyromegaly.     Trachea: No tracheal deviation.  Cardiovascular:     Rate and Rhythm: Normal rate.  Pulmonary:     Effort: Pulmonary effort is normal.  Abdominal:     Palpations: Abdomen is soft.  Musculoskeletal:     Cervical back: No rigidity.  Skin:    General: Skin is warm and dry.  Neurological:     Mental Status: She is alert and oriented to person, place, and time.  Psychiatric:        Behavior: Behavior normal.  Ortho Exam patient has medial joint line tenderness.  2+ knee effusion.  Negative logroll to the hips.  Sensory to the foot is intact.  Specialty Comments:  No specialty comments available.  Imaging: No results found.   PMFS History: Patient Active Problem List   Diagnosis Date Noted   History of syphilis 02/19/2021   Diabetes mellitus (HCC) 02/19/2021   Dyslipidemia 02/19/2021   Hypertension 02/19/2021   History of atrial fibrillation 02/19/2021   Atrial septal defect 02/19/2021   Patent foramen ovale 02/19/2021   History of CVA (cerebrovascular accident) 02/19/2021   Cigarette smoker 02/19/2021   Locked knee, right 09/06/2019   Past Medical History:  Diagnosis Date   Atrial fibrillation (HCC)    Diabetes mellitus without complication (HCC)    TIA (transient ischemic attack) 2017    No family history on file.  Past Surgical History:  Procedure Laterality Date   CARDIAC SURGERY  10/2016   repair PFO   Social History   Occupational History    Not on file  Tobacco Use   Smoking status: Every Day    Packs/day: 0.25    Years: 6.00    Pack years: 1.50    Types: Cigarettes   Smokeless tobacco: Never  Vaping Use   Vaping Use: Never used  Substance and Sexual Activity   Alcohol use: Not Currently   Drug use: Never   Sexual activity: Not on file

## 2021-06-27 NOTE — Telephone Encounter (Signed)
Pt states she is in a lot of pain. She was seen in the ED for her right knee. Ibuprofen is just not helping with the pain. Wanting to be seen today.  CB [phone]

## 2021-06-27 NOTE — Telephone Encounter (Signed)
Worked in at [employer] this afternoon.

## 2021-06-29 ENCOUNTER — Ambulatory Visit
Admission: RE | Admit: 2021-06-29 | Discharge: 2021-06-29 | Disposition: A | Discharge status: Home / Self Care | Payer: 59 | Source: Ambulatory Visit | Attending: Orthopaedic Surgery | Admitting: Orthopaedic Surgery

## 2021-06-29 ENCOUNTER — Other Ambulatory Visit: Payer: Self-pay

## 2021-06-29 DIAGNOSIS — M2391 Unspecified internal derangement of right knee: Secondary | ICD-10-CM

## 2021-07-01 ENCOUNTER — Encounter: Payer: Self-pay | Admitting: Orthopaedic Surgery

## 2021-07-01 ENCOUNTER — Ambulatory Visit: Payer: 59 | Admitting: Orthopaedic Surgery

## 2021-07-01 ENCOUNTER — Ambulatory Visit (INDEPENDENT_AMBULATORY_CARE_PROVIDER_SITE_OTHER): Payer: 59 | Admitting: Orthopaedic Surgery

## 2021-07-01 ENCOUNTER — Other Ambulatory Visit: Payer: Self-pay

## 2021-07-01 VITALS — BP 170/84 | HR 123 | Ht 67.0 in | Wt 135.0 lb

## 2021-07-01 DIAGNOSIS — M2391 Unspecified internal derangement of right knee: Secondary | ICD-10-CM

## 2021-07-04 ENCOUNTER — Other Ambulatory Visit: Payer: Self-pay | Admitting: Orthopaedic Surgery

## 2021-07-04 ENCOUNTER — Telehealth: Payer: Self-pay | Admitting: Orthopaedic Surgery

## 2021-07-04 MED ORDER — HYDROCODONE-ACETAMINOPHEN 5-325 MG PO TABS: 1.0000 | ORAL_TABLET | ORAL | 0 refills | Status: DC | PRN

## 2021-07-04 NOTE — Telephone Encounter (Signed)
I called patient and advised. 

## 2021-07-04 NOTE — Telephone Encounter (Signed)
Pt called requesting a refill before the end of the day of hydrocodone. Pt states she will be out before the holiday weekend. Please send to pharmacy on file. Pt phone number is [phone].

## 2021-07-04 NOTE — Telephone Encounter (Signed)
Please advise 

## 2021-07-07 NOTE — Progress Notes (Signed)
Office Visit Note   Patient: Tina [name]           Date of Birth: 12-03-1970           MRN: [id number] Visit Date: 07/01/2021              Requested by: No referring provider defined for this encounter. PCP: Patient, No Pcp Per (Inactive)   Assessment & Plan: Visit Diagnoses:  1. Locked knee, right           Right knee medial meniscal tear.  Plan: Locking right knee with medial meniscal tear.  She continues to have locking catching in her knee she is in a knee immobilizer.  She had to be given conscious sedation and manually reduced in the ER by the ER MD.  Scan is reviewed with patient which shows meniscal tear.  There is no subluxed fragment but has had repetitive locking.  We will proceed with outpatient arthroscopy.  We discussed operative technique outpatient procedure continue use of crutches short-term.  Questions were elicited and answered she understands question proceed.  Follow-Up Instructions: No follow-ups on file.   Orders:  No orders of the defined types were placed in this encounter.  No orders of the defined types were placed in this encounter.     Procedures: No procedures performed   Clinical Data: No additional findings.   Subjective: Chief Complaint  Patient presents with   Right Knee - Follow-up    MRI review    HPI 50 year old female returns post knee MRI scan right knee.  Patient is in a knee immobilizer wearing crutches and taking hydrocodone which she states is not helping.  Previous patent ductus repair 2018.  She has had locking of her right knee.  MRI scan 06/30/2021 showed medial meniscal tear of the body extending to the superior articular surface.  All other structures were normal.  Review of Systems past history of CVA, patent ductus repair 2018 history of A. fib diabetes cigarette smoker.   Objective: Vital Signs: BP (!) 170/84    Pulse (!) 123    Ht 5\' 7"  (1.702 m)    Wt 135 lb (61.2 kg)    BMI 21.14 kg/m   Physical  Exam Constitutional:      Appearance: She is well-developed.  HENT:     Head: Normocephalic.     Right Ear: External ear normal.     Left Ear: External ear normal. There is no impacted cerumen.  Eyes:     Pupils: Pupils are equal, round, and reactive to light.  Neck:     Thyroid: No thyromegaly.     Trachea: No tracheal deviation.  Cardiovascular:     Rate and Rhythm: Normal rate.  Pulmonary:     Effort: Pulmonary effort is normal.  Abdominal:     Palpations: Abdomen is soft.  Musculoskeletal:     Cervical back: No rigidity.  Skin:    General: Skin is warm and dry.  Neurological:     Mental Status: She is alert and oriented to person, place, and time.  Psychiatric:        Behavior: Behavior normal.    Ortho Exam patient is amatory with a knee immobilizer she is using crutches.  She has exquisite tenderness of palpation along the medial joint line.  Reflexes ankle jerk are 2+ and symmetrical she has sharp pain with flexion of her knee points to the medial joint line.  Medial collateral ligament is stable.  Lateral collateral  is normal.  ACL PCL exam is normal.  Pedal pulses are normal.    Specialty Comments:  No specialty comments available.  Imaging: No results found.CLINICAL DATA:  Right knee pain.  No known injury.   EXAM: MRI OF THE RIGHT KNEE WITHOUT CONTRAST   TECHNIQUE: Multiplanar, multisequence MR imaging of the knee was performed. No intravenous contrast was administered.   COMPARISON:  None.   FINDINGS: MENISCI   Medial: Small horizontal tear of the body of the medial meniscus extending to the superior articular surface. Degeneration of the posterior horn of the medial meniscus with minimal peripheral meniscal extrusion.   Lateral: Intact.   LIGAMENTS   Cruciates: ACL and PCL are intact.   Collaterals: Medial collateral ligament is intact. Lateral collateral ligament complex is intact.   CARTILAGE   Patellofemoral:  No chondral defect.    Medial:  No chondral defect.   Lateral:  No chondral defect.   JOINT: No joint effusion. Normal Hoffa's fat-pad. No plical thickening.   POPLITEAL FOSSA: Popliteus tendon is intact. No Baker's cyst.   EXTENSOR MECHANISM: Intact quadriceps tendon. Intact patellar tendon. Intact lateral patellar retinaculum. Intact medial patellar retinaculum. Intact MPFL.   BONES: No aggressive osseous lesion. No fracture or dislocation.   Other: No fluid collection or hematoma. Muscles are normal.   IMPRESSION: 1. Small horizontal tear of the body of the medial meniscus extending to the superior articular surface. 2.  No acute osseous injury of the right knee.     Electronically Signed   By: Elige Ko M.D.   On: 06/30/2021 05:13     PMFS History: Patient Active Problem List   Diagnosis Date Noted   History of syphilis 02/19/2021   Diabetes mellitus (HCC) 02/19/2021   Dyslipidemia 02/19/2021   Hypertension 02/19/2021   History of atrial fibrillation 02/19/2021   Atrial septal defect 02/19/2021   Patent foramen ovale 02/19/2021   History of CVA (cerebrovascular accident) 02/19/2021   Cigarette smoker 02/19/2021   Locked knee, right 09/06/2019   Past Medical History:  Diagnosis Date   Atrial fibrillation (HCC)    Diabetes mellitus without complication (HCC)    TIA (transient ischemic attack) 2017    No family history on file.  Past Surgical History:  Procedure Laterality Date   CARDIAC SURGERY  10/2016   repair PFO   Social History   Occupational History   Not on file  Tobacco Use   Smoking status: Every Day    Packs/day: 0.25    Years: 6.00    Pack years: 1.50    Types: Cigarettes   Smokeless tobacco: Never  Vaping Use   Vaping Use: Never used  Substance and Sexual Activity   Alcohol use: Not Currently   Drug use: Never   Sexual activity: Not on file

## 2021-07-15 ENCOUNTER — Other Ambulatory Visit: Payer: Self-pay

## 2021-07-15 ENCOUNTER — Encounter (HOSPITAL_COMMUNITY): Payer: Self-pay | Admitting: Emergency Medicine

## 2021-07-15 ENCOUNTER — Emergency Department (HOSPITAL_COMMUNITY)
Admission: EM | Admit: 2021-07-15 | Discharge: 2021-07-15 | Disposition: A | Discharge status: Home / Self Care | Payer: 59 | Attending: Emergency Medicine | Admitting: Emergency Medicine

## 2021-07-15 DIAGNOSIS — R1013 Epigastric pain: Secondary | ICD-10-CM | POA: Insufficient documentation

## 2021-07-15 DIAGNOSIS — R110 Nausea: Secondary | ICD-10-CM | POA: Insufficient documentation

## 2021-07-15 DIAGNOSIS — R1011 Right upper quadrant pain: Secondary | ICD-10-CM | POA: Insufficient documentation

## 2021-07-15 DIAGNOSIS — Z5321 Procedure and treatment not carried out due to patient leaving prior to being seen by health care provider: Secondary | ICD-10-CM | POA: Insufficient documentation

## 2021-07-15 LAB — CBC WITH DIFFERENTIAL/PLATELET
Abs Immature Granulocytes: 0.02 10*3/uL (ref 0.00–0.07)
Basophils Absolute: 0.1 10*3/uL (ref 0.0–0.1)
Basophils Relative: 1 %
Eosinophils Absolute: 0.3 10*3/uL (ref 0.0–0.5)
Eosinophils Relative: 3 %
HCT: 44.3 % (ref 36.0–46.0)
Hemoglobin: 15.6 g/dL — ABNORMAL HIGH (ref 12.0–15.0)
Immature Granulocytes: 0 %
Lymphocytes Relative: 45 %
Lymphs Abs: 4.1 10*3/uL — ABNORMAL HIGH (ref 0.7–4.0)
MCH: 30.5 pg (ref 26.0–34.0)
MCHC: 35.2 g/dL (ref 30.0–36.0)
MCV: 86.7 fL (ref 80.0–100.0)
Monocytes Absolute: 0.5 10*3/uL (ref 0.1–1.0)
Monocytes Relative: 6 %
Neutro Abs: 4 10*3/uL (ref 1.7–7.7)
Neutrophils Relative %: 45 %
Platelets: 202 10*3/uL (ref 150–400)
RBC: 5.11 MIL/uL (ref 3.87–5.11)
RDW: 12.2 % (ref 11.5–15.5)
WBC: 9 10*3/uL (ref 4.0–10.5)
nRBC: 0 % (ref 0.0–0.2)

## 2021-07-15 LAB — COMPREHENSIVE METABOLIC PANEL
ALT: 21 U/L (ref 0–44)
AST: 19 U/L (ref 15–41)
Albumin: 3.9 g/dL (ref 3.5–5.0)
Alkaline Phosphatase: 86 U/L (ref 38–126)
Anion gap: 8 (ref 5–15)
BUN: 7 mg/dL (ref 6–20)
CO2: 28 mmol/L (ref 22–32)
Calcium: 9.5 mg/dL (ref 8.9–10.3)
Chloride: 102 mmol/L (ref 98–111)
Creatinine, Ser: 0.62 mg/dL (ref 0.44–1.00)
GFR, Estimated: >60 mL/min (ref >60)
Glucose, Bld: 254 mg/dL — ABNORMAL HIGH (ref 70–99)
Potassium: 4.2 mmol/L (ref 3.5–5.1)
Sodium: 138 mmol/L (ref 135–145)
Total Bilirubin: 0.8 mg/dL (ref 0.3–1.2)
Total Protein: 7.1 g/dL (ref 6.5–8.1)

## 2021-07-15 LAB — LIPASE, BLOOD: Lipase: 99 U/L — ABNORMAL HIGH (ref 11–51)

## 2021-07-15 NOTE — ED Notes (Signed)
No answer when call for vital signs x2

## 2021-07-15 NOTE — ED Triage Notes (Signed)
Patient reports pancreatitis flare up onset yesterday with mid/RUQ pain and nausea .

## 2021-07-15 NOTE — ED Provider Notes (Signed)
Emergency Medicine Provider Triage Evaluation Note  Tina [name] , a 51 y.o. female  was evaluated in triage.  Pt complains of a pancreatitis flareup. She states that she had a few sips of alcohol for New Year's.  When she woke up the morning of the first she had epigastric and right upper quadrant abdominal pain.  She has had her gallbladder removed previously.  She states that this is her third episode of pancreatitis and that she feels like this is similar type of pain, similar location, but the pain is more severe.  She tried treating this at home with clears prior to coming in.  She reports nausea without vomiting.  No fevers.  Review of Systems  Positive: Abdominal pain, nausea Negative: Vomiting, diarrhea  Physical Exam  BP (!) 172/110    Pulse 94    Temp 98.4 F (36.9 C) (Oral)    Resp 16    Ht 5\' 7"  (1.702 m)    Wt 65 kg    SpO2 98%    BMI 22.44 kg/m  Gen:   Awake, no distress   Resp:  Normal effort  MSK:   Moves extremities without difficulty  Other:  Abdomen is tender to palpation epigastric and right upper quadrant.  No CVA tenderness to percussion or back pain tenderness to palpation.  Medical Decision Making  Medically screening exam initiated at 1:02 AM.  Appropriate orders placed.  Janye Stepter was informed that the remainder of the evaluation will be completed by another provider, this initial triage assessment does not replace that evaluation, and the importance of remaining in the ED until their evaluation is complete.  Will order labs.   , PA-C 07/15/21 0104    09/12/21, MD 07/15/21 [phone]

## 2021-07-21 ENCOUNTER — Telehealth: Payer: Self-pay | Admitting: Orthopaedic Surgery

## 2021-07-21 ENCOUNTER — Telehealth: Payer: Self-pay

## 2021-07-21 NOTE — Telephone Encounter (Signed)
Please advise 

## 2021-07-21 NOTE — Telephone Encounter (Signed)
I spoke with patient earlier today.  She switched insurance.  I was not aware.  She stated [facility] was trying to collect more money from her than what she owed.  She wants surgery rescheduled to a different facility.  I advised I would look at [facility] to schedule and let her know when I could find time.

## 2021-07-21 NOTE — Telephone Encounter (Signed)
Patient did not get to have surgery today due to insurance/payment issue.  Going to reschedule at [facility].  Can you refill pain medication?  She uses [facility] on [address].  Please call patient to advise.

## 2021-07-21 NOTE — Telephone Encounter (Signed)
Pt calling to get the surg resch'd as it did not happen this morning. Pt stated she had an issue where surg would be taking place but did not go into detail. The best call back number is [phone].

## 2021-07-21 NOTE — Telephone Encounter (Signed)
noted 

## 2021-07-21 NOTE — Telephone Encounter (Signed)
Pt asking for refill on her hydrocodone as she did not have surg as planned for today. The best pharmacy is [facility] [phone] [place], [place] - [address] [facility] [address] and the best call back number is [phone].

## 2021-07-22 ENCOUNTER — Other Ambulatory Visit: Payer: Self-pay | Admitting: Orthopaedic Surgery

## 2021-07-22 MED ORDER — HYDROCODONE-ACETAMINOPHEN 5-325 MG PO TABS: 1.0000 | ORAL_TABLET | ORAL | 0 refills | Status: DC | PRN

## 2021-07-22 NOTE — Telephone Encounter (Signed)
noted 

## 2021-07-23 NOTE — Telephone Encounter (Signed)
Scheduled patient for [facility] on 07-28-21 9:15 a.m.  Patient was notified.

## 2021-07-24 ENCOUNTER — Other Ambulatory Visit: Payer: Self-pay

## 2021-07-24 ENCOUNTER — Encounter (HOSPITAL_BASED_OUTPATIENT_CLINIC_OR_DEPARTMENT_OTHER): Payer: Self-pay | Admitting: Orthopaedic Surgery

## 2021-07-27 NOTE — Anesthesia Preprocedure Evaluation (Addendum)
Anesthesia Evaluation  Patient identified by MRN, date of birth, ID band Patient awake    Reviewed: Allergy & Precautions, NPO status , Patient's Chart, lab work & pertinent test results  Airway Mallampati: II  TM Distance: >3 FB Neck ROM: Full    Dental no notable dental hx. (+) Teeth Intact, Dental Advisory Given   Pulmonary Current Smoker and Patient abstained from smoking.,    Pulmonary exam normal breath sounds clear to auscultation       Cardiovascular hypertension, Pt. on medications Normal cardiovascular exam+ dysrhythmias Atrial Fibrillation  Rhythm:Regular Rate:Normal  S/p PFO closure 2018   Neuro/Psych TIA   GI/Hepatic negative GI ROS, Neg liver ROS,   Endo/Other  diabetes  Renal/GU Lab Results      Component                Value               Date                      CREATININE               0.62                07/15/2021                BUN                      7                   07/15/2021                NA                       138                 07/15/2021                K                        4.2                 07/15/2021                CL                       102                 07/15/2021                CO2                      28                  07/15/2021                Musculoskeletal negative musculoskeletal ROS (+)   Abdominal   Peds  Hematology Lab Results      Component                Value               Date                      WBC  9.0                 07/15/2021                HGB                      15.6 (H)            07/15/2021                HCT                      44.3                07/15/2021                MCV                      86.7                07/15/2021                PLT                      202                 07/15/2021              Anesthesia Other Findings [id number]  Reproductive/Obstetrics negative OB ROS                             Anesthesia Physical Anesthesia Plan  ASA: 2  Anesthesia Plan: General   Post-op Pain Management:    Induction: Intravenous  PONV Risk Score and Plan: 3 and Treatment may vary due to age or medical condition, Ondansetron and Midazolam  Airway Management Planned: LMA  Additional Equipment: None  Intra-op Plan:   Post-operative Plan:   Informed Consent: I have reviewed the patients History and Physical, chart, labs and discussed the procedure including the risks, benefits and alternatives for the proposed anesthesia with the patient or authorized representative who has indicated his/her understanding and acceptance.     Dental advisory given  Plan Discussed with: CRNA and Anesthesiologist  Anesthesia Plan Comments:        Anesthesia Quick Evaluation

## 2021-07-28 ENCOUNTER — Other Ambulatory Visit: Payer: Self-pay

## 2021-07-28 ENCOUNTER — Ambulatory Visit (HOSPITAL_BASED_OUTPATIENT_CLINIC_OR_DEPARTMENT_OTHER): Payer: No Typology Code available for payment source | Admitting: Anesthesiology

## 2021-07-28 ENCOUNTER — Ambulatory Visit (HOSPITAL_BASED_OUTPATIENT_CLINIC_OR_DEPARTMENT_OTHER)
Admission: RE | Admit: 2021-07-28 | Discharge: 2021-07-28 | Disposition: A | Discharge status: Home / Self Care | Payer: No Typology Code available for payment source | Attending: Orthopaedic Surgery | Admitting: Orthopaedic Surgery

## 2021-07-28 ENCOUNTER — Encounter (HOSPITAL_BASED_OUTPATIENT_CLINIC_OR_DEPARTMENT_OTHER): Payer: Self-pay | Admitting: Orthopaedic Surgery

## 2021-07-28 ENCOUNTER — Encounter (HOSPITAL_BASED_OUTPATIENT_CLINIC_OR_DEPARTMENT_OTHER)
Admission: RE | Disposition: A | Discharge status: Home / Self Care | Payer: Self-pay | Source: Home / Self Care | Attending: Orthopaedic Surgery

## 2021-07-28 DIAGNOSIS — I10 Essential (primary) hypertension: Secondary | ICD-10-CM | POA: Diagnosis not present

## 2021-07-28 DIAGNOSIS — X58XXXA Exposure to other specified factors, initial encounter: Secondary | ICD-10-CM | POA: Diagnosis not present

## 2021-07-28 DIAGNOSIS — Z8673 Personal history of transient ischemic attack (TIA), and cerebral infarction without residual deficits: Secondary | ICD-10-CM | POA: Diagnosis not present

## 2021-07-28 DIAGNOSIS — E119 Type 2 diabetes mellitus without complications: Secondary | ICD-10-CM | POA: Diagnosis not present

## 2021-07-28 DIAGNOSIS — F17210 Nicotine dependence, cigarettes, uncomplicated: Secondary | ICD-10-CM | POA: Diagnosis not present

## 2021-07-28 DIAGNOSIS — Z01818 Encounter for other preprocedural examination: Secondary | ICD-10-CM

## 2021-07-28 DIAGNOSIS — S83241D Other tear of medial meniscus, current injury, right knee, subsequent encounter: Secondary | ICD-10-CM

## 2021-07-28 DIAGNOSIS — I4891 Unspecified atrial fibrillation: Secondary | ICD-10-CM | POA: Insufficient documentation

## 2021-07-28 DIAGNOSIS — S83241A Other tear of medial meniscus, current injury, right knee, initial encounter: Secondary | ICD-10-CM | POA: Diagnosis present

## 2021-07-28 DIAGNOSIS — Z9889 Other specified postprocedural states: Secondary | ICD-10-CM | POA: Diagnosis not present

## 2021-07-28 HISTORY — PX: KNEE ARTHROSCOPY WITH MEDIAL MENISECTOMY: SHX5651

## 2021-07-28 LAB — GLUCOSE, CAPILLARY
Glucose-Capillary: 210 mg/dL — ABNORMAL HIGH (ref 70–99)
Glucose-Capillary: 211 mg/dL — ABNORMAL HIGH (ref 70–99)

## 2021-07-28 SURGERY — ARTHROSCOPY, KNEE, WITH MEDIAL MENISCECTOMY
Anesthesia: General | Site: Knee | Laterality: Right

## 2021-07-28 MED ORDER — LIDOCAINE 2% (20 MG/ML) 5 ML SYRINGE
INTRAMUSCULAR | Status: AC
  Filled 2021-07-28: qty 5

## 2021-07-28 MED ORDER — AMISULPRIDE (ANTIEMETIC) 5 MG/2ML IV SOLN: 10.0000 mg | Freq: Once | INTRAVENOUS | Status: DC | PRN

## 2021-07-28 MED ORDER — MIDAZOLAM HCL 2 MG/2ML IJ SOLN
INTRAMUSCULAR | Status: AC
  Filled 2021-07-28: qty 2

## 2021-07-28 MED ORDER — ONDANSETRON HCL 4 MG/2ML IJ SOLN: 4.0000 mg | Freq: Once | INTRAMUSCULAR | Status: DC | PRN

## 2021-07-28 MED ORDER — MIDAZOLAM HCL 5 MG/5ML IJ SOLN
INTRAMUSCULAR | Status: DC | PRN
  Administered 2021-07-28: 2 mg via INTRAVENOUS

## 2021-07-28 MED ORDER — CEFAZOLIN SODIUM-DEXTROSE 2-4 GM/100ML-% IV SOLN
2.0000 g | INTRAVENOUS | Status: AC
  Administered 2021-07-28: 2 g via INTRAVENOUS

## 2021-07-28 MED ORDER — PROPOFOL 10 MG/ML IV BOLUS
INTRAVENOUS | Status: AC
  Filled 2021-07-28: qty 20

## 2021-07-28 MED ORDER — FENTANYL CITRATE (PF) 100 MCG/2ML IJ SOLN
INTRAMUSCULAR | Status: DC | PRN
  Administered 2021-07-28: 50 ug via INTRAVENOUS
  Administered 2021-07-28 (×2): 25 ug via INTRAVENOUS

## 2021-07-28 MED ORDER — DEXMEDETOMIDINE (PRECEDEX) IN NS 20 MCG/5ML (4 MCG/ML) IV SYRINGE
PREFILLED_SYRINGE | INTRAVENOUS | Status: DC | PRN
  Administered 2021-07-28 (×2): 8 ug via INTRAVENOUS

## 2021-07-28 MED ORDER — OXYCODONE HCL 5 MG/5ML PO SOLN: 5.0000 mg | Freq: Once | ORAL | Status: AC | PRN

## 2021-07-28 MED ORDER — BUPIVACAINE-EPINEPHRINE 0.25% -1:200000 IJ SOLN
INTRAMUSCULAR | Status: DC | PRN
  Administered 2021-07-28: 20 mL

## 2021-07-28 MED ORDER — DEXAMETHASONE SODIUM PHOSPHATE 10 MG/ML IJ SOLN
INTRAMUSCULAR | Status: DC | PRN
Start: 2021-07-28 — End: 2021-07-28
  Administered 2021-07-28: 4 mg via INTRAVENOUS

## 2021-07-28 MED ORDER — OXYCODONE HCL 5 MG PO TABS
ORAL_TABLET | ORAL | Status: AC
  Filled 2021-07-28: qty 1

## 2021-07-28 MED ORDER — SODIUM CHLORIDE 0.9 % IR SOLN
Status: DC | PRN
  Administered 2021-07-28: 3000 mL

## 2021-07-28 MED ORDER — OXYCODONE-ACETAMINOPHEN 5-325 MG PO TABS
1.0000 | ORAL_TABLET | ORAL | 0 refills | Status: AC | PRN
Start: 2021-07-28 — End: 2022-07-28

## 2021-07-28 MED ORDER — CEFAZOLIN SODIUM-DEXTROSE 2-4 GM/100ML-% IV SOLN
INTRAVENOUS | Status: AC
  Filled 2021-07-28: qty 100

## 2021-07-28 MED ORDER — ACETAMINOPHEN 10 MG/ML IV SOLN: 1000.0000 mg | Freq: Once | INTRAVENOUS | Status: DC | PRN

## 2021-07-28 MED ORDER — LACTATED RINGERS IV SOLN: INTRAVENOUS | Status: DC

## 2021-07-28 MED ORDER — ONDANSETRON HCL 4 MG/2ML IJ SOLN
INTRAMUSCULAR | Status: DC | PRN
  Administered 2021-07-28: 4 mg via INTRAVENOUS

## 2021-07-28 MED ORDER — ONDANSETRON HCL 4 MG/2ML IJ SOLN
INTRAMUSCULAR | Status: AC
  Filled 2021-07-28: qty 2

## 2021-07-28 MED ORDER — LIDOCAINE HCL (CARDIAC) PF 100 MG/5ML IV SOSY
PREFILLED_SYRINGE | INTRAVENOUS | Status: DC | PRN
Start: 2021-07-28 — End: 2021-07-28
  Administered 2021-07-28: 60 mg via INTRAVENOUS

## 2021-07-28 MED ORDER — DEXAMETHASONE SODIUM PHOSPHATE 10 MG/ML IJ SOLN
INTRAMUSCULAR | Status: AC
  Filled 2021-07-28: qty 1

## 2021-07-28 MED ORDER — OXYCODONE HCL 5 MG PO TABS
5.0000 mg | ORAL_TABLET | Freq: Once | ORAL | Status: AC | PRN
  Administered 2021-07-28: 5 mg via ORAL

## 2021-07-28 MED ORDER — PROPOFOL 10 MG/ML IV BOLUS
INTRAVENOUS | Status: DC | PRN
  Administered 2021-07-28: 150 mg via INTRAVENOUS

## 2021-07-28 MED ORDER — HYDROMORPHONE HCL 1 MG/ML IJ SOLN
0.2500 mg | INTRAMUSCULAR | Status: DC | PRN
  Administered 2021-07-28: 0.25 mg via INTRAVENOUS

## 2021-07-28 MED ORDER — HYDROMORPHONE HCL 1 MG/ML IJ SOLN
INTRAMUSCULAR | Status: AC
  Filled 2021-07-28: qty 0.5

## 2021-07-28 MED ORDER — FENTANYL CITRATE (PF) 100 MCG/2ML IJ SOLN
INTRAMUSCULAR | Status: AC
  Filled 2021-07-28: qty 2

## 2021-07-28 MED ORDER — PHENYLEPHRINE HCL (PRESSORS) 10 MG/ML IV SOLN
INTRAVENOUS | Status: DC | PRN
  Administered 2021-07-28 (×2): 40 ug via INTRAVENOUS
  Administered 2021-07-28: 80 ug via INTRAVENOUS

## 2021-07-28 SURGICAL SUPPLY — 35 items
APL SKNCLS STERI-STRIP NONHPOA (GAUZE/BANDAGES/DRESSINGS) ×1
BENZOIN TINCTURE PRP APPL 2/3 (GAUZE/BANDAGES/DRESSINGS) ×2 IMPLANT
BLADE EXCALIBUR 4.0X13 (MISCELLANEOUS) IMPLANT
BNDG ELASTIC 6X5.8 VLCR STR LF (GAUZE/BANDAGES/DRESSINGS) ×4 IMPLANT
DISSECTOR  3.8MM X 13CM (MISCELLANEOUS) ×2
DISSECTOR 3.8MM X 13CM (MISCELLANEOUS) ×1 IMPLANT
DRAPE ARTHROSCOPY W/POUCH 90 (DRAPES) ×2 IMPLANT
DRSG TEGADERM 4X4.75 (GAUZE/BANDAGES/DRESSINGS) ×2 IMPLANT
DURAPREP 26ML APPLICATOR (WOUND CARE) ×2 IMPLANT
ELECT MENISCUS 165MM 90D (ELECTRODE) IMPLANT
ELECT REM PT RETURN 9FT ADLT (ELECTROSURGICAL)
ELECTRODE REM PT RTRN 9FT ADLT (ELECTROSURGICAL) IMPLANT
GAUZE SPONGE 4X4 12PLY STRL (GAUZE/BANDAGES/DRESSINGS) ×2 IMPLANT
GLOVE SRG 8 PF TXTR STRL LF DI (GLOVE) ×1 IMPLANT
GLOVE SURG ENC MOIS LTX SZ7.5 (GLOVE) ×2 IMPLANT
GLOVE SURG UNDER POLY LF SZ8 (GLOVE) ×2
GOWN STRL REUS W/ TWL LRG LVL3 (GOWN DISPOSABLE) IMPLANT
GOWN STRL REUS W/TWL LRG LVL3 (GOWN DISPOSABLE) ×2
HOLDER KNEE FOAM BLUE (MISCELLANEOUS) ×2 IMPLANT
MANIFOLD NEPTUNE II (INSTRUMENTS) ×1 IMPLANT
PACK ARTHROSCOPY DSU (CUSTOM PROCEDURE TRAY) ×2 IMPLANT
PACK BASIN DAY SURGERY FS (CUSTOM PROCEDURE TRAY) ×2 IMPLANT
PAD CAST 4YDX4 CTTN HI CHSV (CAST SUPPLIES) ×1 IMPLANT
PADDING CAST ABS 4INX4YD NS (CAST SUPPLIES)
PADDING CAST ABS 6INX4YD NS (CAST SUPPLIES)
PADDING CAST ABS COTTON 4X4 ST (CAST SUPPLIES) IMPLANT
PADDING CAST ABS COTTON 6X4 NS (CAST SUPPLIES) IMPLANT
PADDING CAST COTTON 4X4 STRL (CAST SUPPLIES) ×2
PADDING CAST COTTON 6X4 STRL (CAST SUPPLIES) ×2 IMPLANT
PENCIL SMOKE EVACUATOR (MISCELLANEOUS) IMPLANT
STRIP CLOSURE SKIN 1/4X4 (GAUZE/BANDAGES/DRESSINGS) ×2 IMPLANT
TOWEL GREEN STERILE FF (TOWEL DISPOSABLE) ×2 IMPLANT
TUBING ARTHROSCOPY IRRIG 16FT (MISCELLANEOUS) ×2 IMPLANT
WATER STERILE IRR 1000ML POUR (IV SOLUTION) ×1 IMPLANT
WRAP KNEE MAXI GEL POST OP (GAUZE/BANDAGES/DRESSINGS) IMPLANT

## 2021-07-28 NOTE — Op Note (Signed)
Pre and postop diagnosis: Right knee posterior medial meniscal tear.  Procedure: Diagnostic and operative arthroscopy right knee partial posterior medial meniscectomy.  Surgeon: Loraine Leriche.  Andres Vest MD  Anesthesia: LMA +20 cc Marcaine with epi local.  EBL: Minimal.  Operative findings: Posterior medial meniscal flap tear.  Partial posterior medial meniscectomy performed.  Procedure: Patient reported repetitive catching occasionally locking of her knee and had delayed surgery for few months.  She had been in knee immobilizer and crutches in the past.  MRI showed a posterior medial meniscal tear.  After standard prepping draping with leg holder in a well leg holder on the opposite leg foot of the bed was taken down prepped with DuraPrep from the leg holder down to the ankle.  MRI scan showed a posterior tear of the medial meniscus with minimal peripheral meniscal extrusion.  Tear was horizontal involving the body of the meniscus extending to the superior articular surface.  Inflow was placed through the scope after timeout procedure.  Ancef prophylaxis 2 g.  Inflow was placed to the scope 60 cm pressure and medial lateral parapatellar tendon portals were used.  Knee was sequentially inspected patellofemoral joint was normal.  There was some prominence of medial plica which trimmed back was not thickened and fibrotic.  ACL PCL was normal.  Lateral meniscus carefully probed and was normal.  Anterior and midportion of the medial meniscus was normal.  Posterior medial meniscus had flap tear that extended to the superior articular surface.  Using combination of straight flat baskets and the shaver a partial posterior medial meniscectomy was performed to illuminate remaining rim was stable.  Knee was thoroughly insufflated suctioned dry tincture benzoin Steri-Strips Tegaderm 4 x 4's web roll and Ace wrap 6 inch x 2.  Patient tolerated the procedure well was transferred recovery room in stable condition.  Outpatient  surgery is appropriate treatment of this condition.

## 2021-07-28 NOTE — Discharge Instructions (Addendum)
You may remove the Ace wrap cotton dressing and 4 x 4's leaving on the Tegaderm which looks like clear Saran wrap.  Underneath there will be some blood that should look like grape juice.  Leave this on you can shower dry off with a towel and then reapply Ace wrap.  Ice elevation will help with knee pain.  Use your crutches if needed.  See Dr. Ophelia Charter in 1 week.    Post Anesthesia Home Care Instructions  Activity: Get plenty of rest for the remainder of the day. A responsible individual must stay with you for 24 hours following the procedure.  For the next 24 hours, DO NOT: -Drive a car -Advertising copywriter -Drink alcoholic beverages -Take any medication unless instructed by your physician -Make any legal decisions or sign important papers.  Meals: Start with liquid foods such as gelatin or soup. Progress to regular foods as tolerated. Avoid greasy, spicy, heavy foods. If nausea and/or vomiting occur, drink only clear liquids until the nausea and/or vomiting subsides. Call your physician if vomiting continues.  Special Instructions/Symptoms: Your throat may feel dry or sore from the anesthesia or the breathing tube placed in your throat during surgery. If this causes discomfort, gargle with warm salt water. The discomfort should disappear within 24 hours.  If you had a scopolamine patch placed behind your ear for the management of post- operative nausea and/or vomiting:  1. The medication in the patch is effective for 72 hours, after which it should be removed.  Wrap patch in a tissue and discard in the trash. Wash hands thoroughly with soap and water. 2. You may remove the patch earlier than 72 hours if you experience unpleasant side effects which may include dry mouth, dizziness or visual disturbances. 3. Avoid touching the patch. Wash your hands with soap and water after contact with the patch.

## 2021-07-28 NOTE — Anesthesia Postprocedure Evaluation (Signed)
Anesthesia Post Note  Patient: Optometrist  Procedure(s) Performed: RIGHT KNEE ARTHROSCOPY WITH PARTIAL MEDIAL MENISCECTOMY (Right: Knee)     Patient location during evaluation: PACU Anesthesia Type: General Level of consciousness: awake and alert Pain management: pain level controlled Vital Signs Assessment: post-procedure vital signs reviewed and stable Respiratory status: spontaneous breathing, nonlabored ventilation, respiratory function stable and patient connected to nasal cannula oxygen Cardiovascular status: blood pressure returned to baseline and stable Postop Assessment: no apparent nausea or vomiting Anesthetic complications: no   No notable events documented.  Last Vitals:  Vitals:   07/28/21 1115 07/28/21 1145  BP: (!) 151/93 (!) 154/96  Pulse: 78 72  Resp: 11 16  Temp:  36.5 C  SpO2: 100% 96%    Last Pain:  Vitals:   07/28/21 1145  TempSrc:   PainSc: 3                  Barnet Glasgow

## 2021-07-28 NOTE — Interval H&P Note (Signed)
History and Physical Interval Note:  07/28/2021 9:12 AM  Tina [name]  has presented today for surgery, with the diagnosis of right knee medial meniscal tear with locking.  The various methods of treatment have been discussed with the patient and family. After consideration of risks, benefits and other options for treatment, the patient has consented to  Procedure(s): RIGHT KNEE ARTHROSCOPY WITH PARTIAL MEDIAL MENISCECTOMY (Right) as a surgical intervention.  The patient's history has been reviewed, patient examined, no change in status, stable for surgery.  I have reviewed the patient's chart and labs.  Questions were answered to the patient's satisfaction.     Marybelle Killings

## 2021-07-28 NOTE — Anesthesia Procedure Notes (Signed)
Procedure Name: LMA Insertion Date/Time: 07/28/2021 9:42 AM Performed by: Burna Cash, CRNA Pre-anesthesia Checklist: Patient identified, Emergency Drugs available, Suction available and Patient being monitored Patient Re-evaluated:Patient Re-evaluated prior to induction Oxygen Delivery Method: Circle system utilized Preoxygenation: Pre-oxygenation with 100% oxygen Induction Type: IV induction Ventilation: Mask ventilation without difficulty LMA: LMA inserted LMA Size: 4.0 Number of attempts: 1 Airway Equipment and Method: Bite block Placement Confirmation: positive ETCO2 Tube secured with: Tape Dental Injury: Teeth and Oropharynx as per pre-operative assessment

## 2021-07-28 NOTE — H&P (Signed)
Assessment & Plan: Visit Diagnoses:  1. Locked knee, right           Right knee medial meniscal tear.   Plan: Locking right knee with medial meniscal tear.  She continues to have locking catching in her knee she is in a knee immobilizer.  She had to be given conscious sedation and manually reduced in the ER by the ER MD.  Scan is reviewed with patient which shows meniscal tear.  There is no subluxed fragment but has had repetitive locking.  We will proceed with outpatient arthroscopy.  We discussed operative technique outpatient procedure continue use of crutches short-term.  Questions were elicited and answered she understands question proceed.   Follow-Up Instructions: No follow-ups on file.    Orders:  No orders of the defined types were placed in this encounter.   No orders of the defined types were placed in this encounter.        Procedures: No procedures performed     Clinical Data: No additional findings.     Subjective:     Chief Complaint  Patient presents with   Right Knee - Follow-up      MRI review      HPI 51 year old female returns post knee MRI scan right knee.  Patient is in a knee immobilizer wearing crutches and taking hydrocodone which she states is not helping.  Previous patent ductus repair 2018.  She has had locking of her right knee.  MRI scan 06/30/2021 showed medial meniscal tear of the body extending to the superior articular surface.  All other structures were normal.   Review of Systems past history of CVA, patent ductus repair 2018 history of A. fib diabetes cigarette smoker.     Objective: Vital Signs: BP (!) 170/84    Pulse (!) 123    Ht 5\' 7"  (1.702 m)    Wt 135 lb (61.2 kg)    BMI 21.14 kg/m    Physical Exam Constitutional:      Appearance: She is well-developed.  HENT:     Head: Normocephalic.     Right Ear: External ear normal.     Left Ear: External ear normal. There is no impacted cerumen.  Eyes:     Pupils: Pupils are equal,  round, and reactive to light.  Neck:     Thyroid: No thyromegaly.     Trachea: No tracheal deviation.  Cardiovascular:     Rate and Rhythm: Normal rate.  Pulmonary:     Effort: Pulmonary effort is normal.  Abdominal:     Palpations: Abdomen is soft.  Musculoskeletal:     Cervical back: No rigidity.  Skin:    General: Skin is warm and dry.  Neurological:     Mental Status: She is alert and oriented to person, place, and time.  Psychiatric:        Behavior: Behavior normal.      Ortho Exam patient is amatory with a knee immobilizer she is using crutches.  She has exquisite tenderness of palpation along the medial joint line.  Reflexes ankle jerk are 2+ and symmetrical she has sharp pain with flexion of her knee points to the medial joint line.  Medial collateral ligament is stable.  Lateral collateral is normal.  ACL PCL exam is normal.  Pedal pulses are normal.      Specialty Comments:  No specialty comments available.   Imaging: No results found.CLINICAL DATA:  Right knee pain.  No known injury.   EXAM:  MRI OF THE RIGHT KNEE WITHOUT CONTRAST   TECHNIQUE: Multiplanar, multisequence MR imaging of the knee was performed. No intravenous contrast was administered.   COMPARISON:  None.   FINDINGS: MENISCI   Medial: Small horizontal tear of the body of the medial meniscus extending to the superior articular surface. Degeneration of the posterior horn of the medial meniscus with minimal peripheral meniscal extrusion.   Lateral: Intact.   LIGAMENTS   Cruciates: ACL and PCL are intact.   Collaterals: Medial collateral ligament is intact. Lateral collateral ligament complex is intact.   CARTILAGE   Patellofemoral:  No chondral defect.   Medial:  No chondral defect.   Lateral:  No chondral defect.   JOINT: No joint effusion. Normal Hoffa's fat-pad. No plical thickening.   POPLITEAL FOSSA: Popliteus tendon is intact. No Baker's cyst.   EXTENSOR MECHANISM:  Intact quadriceps tendon. Intact patellar tendon. Intact lateral patellar retinaculum. Intact medial patellar retinaculum. Intact MPFL.   BONES: No aggressive osseous lesion. No fracture or dislocation.   Other: No fluid collection or hematoma. Muscles are normal.   IMPRESSION: 1. Small horizontal tear of the body of the medial meniscus extending to the superior articular surface. 2.  No acute osseous injury of the right knee.     Electronically Signed   By: Elige Ko M.D.   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       Past Surgical History:  Procedure Laterality Date   CARDIAC SURGERY   10/2016    repair PFO    Social History         Occupational History   Not on file  Tobacco Use   Smoking status: Every Day      Packs/day: 0.25      Years: 6.00      Pack years: 1.50      Types: Cigarettes   Smokeless tobacco: Never  Vaping Use   Vaping Use: Never used  Substance and Sexual Activity   Alcohol use: Not Currently   Drug use: Never   Sexual activity: Not on file

## 2021-07-28 NOTE — Transfer of Care (Signed)
Immediate Anesthesia Transfer of Care Note  Patient: Tina [name]  Procedure(s) Performed: RIGHT KNEE ARTHROSCOPY WITH PARTIAL MEDIAL MENISCECTOMY (Right: Knee)  Patient Location: PACU  Anesthesia Type:General  Level of Consciousness: sedated  Airway & Oxygen Therapy: Patient Spontanous Breathing and Patient connected to face mask oxygen  Post-op Assessment: Report given to RN and Post -op Vital signs reviewed and stable  Post vital signs: Reviewed and stable  Last Vitals:  Vitals Value Taken Time  BP 91/67 07/28/21 1034  Temp    Pulse 78 07/28/21 1036  Resp 13 07/28/21 1036  SpO2 100 % 07/28/21 1036  Vitals shown include unvalidated device data.  Last Pain:  Vitals:   07/28/21 0743  TempSrc: Oral  PainSc: 6       Patients Stated Pain Goal: 5 (07/28/21 0743)  Complications: No notable events documented.

## 2021-07-29 ENCOUNTER — Encounter (HOSPITAL_BASED_OUTPATIENT_CLINIC_OR_DEPARTMENT_OTHER): Payer: Self-pay | Admitting: Orthopaedic Surgery

## 2021-07-29 ENCOUNTER — Encounter: Payer: 59 | Admitting: Orthopaedic Surgery

## 2021-08-05 ENCOUNTER — Encounter: Payer: Self-pay | Admitting: Orthopaedic Surgery

## 2021-08-05 ENCOUNTER — Other Ambulatory Visit: Payer: Self-pay

## 2021-08-05 ENCOUNTER — Ambulatory Visit (INDEPENDENT_AMBULATORY_CARE_PROVIDER_SITE_OTHER): Payer: No Typology Code available for payment source | Admitting: Orthopaedic Surgery

## 2021-08-05 VITALS — BP 178/70 | HR 88 | Ht 67.0 in | Wt 134.0 lb

## 2021-08-05 DIAGNOSIS — S83241S Other tear of medial meniscus, current injury, right knee, sequela: Secondary | ICD-10-CM

## 2021-08-05 DIAGNOSIS — M25561 Pain in right knee: Secondary | ICD-10-CM

## 2021-08-05 MED ORDER — OXYCODONE-ACETAMINOPHEN 5-325 MG PO TABS: 1.0000 | ORAL_TABLET | ORAL | 0 refills | Status: AC | PRN

## 2021-08-05 NOTE — Progress Notes (Signed)
° °  Post-Op Visit Note   Patient: Tina [name]           Date of Birth: June 20, 1971           MRN: [id number] Visit Date: 08/05/2021 PCP: Pcp, No   Assessment & Plan: Postop knee arthroscopy with partial medial meniscectomy right knee when she was having repetitive locking preop.  Final 20 tablets of oxycodone sent in since she is running low.  She is working from home using ice doing leg lifts and working on knee flexion.  Knee is stiff when she first gets up as expected and she still having some pain in the medial aspect of her knee.  Steri-Strips changed incision looks good she can continue with the Ace wrap intermittently for swelling.  Recheck 4 weeks.  Chief Complaint:  Chief Complaint  Patient presents with   Right Knee - Routine Post Op    07/28/2021 right knee arthroscopy, PMM   Visit Diagnoses:  1. Acute medial meniscus tear of right knee, sequela     Plan: Return 4 weeks.  Follow-Up Instructions: No follow-ups on file.   Orders:  No orders of the defined types were placed in this encounter.  Meds ordered this encounter  Medications   oxyCODONE-acetaminophen (PERCOCET/ROXICET) 5-325 MG tablet    Sig: Take 1-2 tablets by mouth every 4 (four) hours as needed for severe pain.    Dispense:  20 tablet    Refill:  0    Imaging: No results found.  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  Past Surgical History:  Procedure Laterality Date   CARDIAC SURGERY  10/2016   repair PFO   KNEE ARTHROSCOPY WITH MEDIAL MENISECTOMY Right 07/28/2021   Procedure: RIGHT KNEE ARTHROSCOPY WITH PARTIAL MEDIAL MENISCECTOMY;  Surgeon: Marybelle Killings, MD;  Location: [facility];  Service: Orthopedics;  Laterality: Right;   Social History   Occupational History   Not on file  Tobacco Use   Smoking status: Every Day    Packs/day: 0.25    Years: 6.00    Pack years: 1.50    Types: Cigarettes   Smokeless tobacco: Never  Vaping Use   Vaping Use: Never used  Substance and Sexual Activity   Alcohol use: Not Currently   Drug use: Never   Sexual activity: Not on file

## 2021-08-19 ENCOUNTER — Other Ambulatory Visit: Payer: Self-pay | Admitting: Orthopaedic Surgery

## 2021-08-19 ENCOUNTER — Telehealth: Payer: Self-pay | Admitting: Orthopaedic Surgery

## 2021-08-19 MED ORDER — TRAMADOL HCL 50 MG PO TABS: 50.0000 mg | ORAL_TABLET | Freq: Two times a day (BID) | ORAL | 0 refills | Status: AC | PRN

## 2021-08-19 NOTE — Telephone Encounter (Signed)
Please advise 

## 2021-08-19 NOTE — Telephone Encounter (Signed)
noted 

## 2021-08-19 NOTE — Telephone Encounter (Signed)
Patient called. She would like a refill on oxycodone called in for her. Her call back number is [phone]

## 2021-08-19 NOTE — Telephone Encounter (Signed)
Pt called again. States she is in a lot of pain

## 2021-09-02 ENCOUNTER — Encounter: Payer: No Typology Code available for payment source | Admitting: Orthopaedic Surgery

## 2022-10-26 ENCOUNTER — Encounter: Payer: Self-pay | Admitting: *Deleted
